# Patient Record
Sex: Female | Born: 1970 | Race: White | Hispanic: No | Marital: Married | Smoking: Never smoker
Health system: Southern US, Community
[De-identification: ages and names within clinical notes are randomized; demographics above are authoritative.]

## PROBLEM LIST (undated history)

## (undated) DIAGNOSIS — K754 Autoimmune hepatitis: Secondary | ICD-10-CM

## (undated) DIAGNOSIS — T7840XA Allergy, unspecified, initial encounter: Secondary | ICD-10-CM

## (undated) DIAGNOSIS — R569 Unspecified convulsions: Secondary | ICD-10-CM

## (undated) DIAGNOSIS — K219 Gastro-esophageal reflux disease without esophagitis: Secondary | ICD-10-CM

## (undated) DIAGNOSIS — K589 Irritable bowel syndrome without diarrhea: Secondary | ICD-10-CM

## (undated) DIAGNOSIS — K2270 Barrett's esophagus without dysplasia: Secondary | ICD-10-CM

## (undated) DIAGNOSIS — K449 Diaphragmatic hernia without obstruction or gangrene: Secondary | ICD-10-CM

## (undated) DIAGNOSIS — I10 Essential (primary) hypertension: Secondary | ICD-10-CM

## (undated) DIAGNOSIS — R945 Abnormal results of liver function studies: Secondary | ICD-10-CM

## (undated) DIAGNOSIS — G43909 Migraine, unspecified, not intractable, without status migrainosus: Secondary | ICD-10-CM

## (undated) DIAGNOSIS — E785 Hyperlipidemia, unspecified: Secondary | ICD-10-CM

## (undated) DIAGNOSIS — E119 Type 2 diabetes mellitus without complications: Secondary | ICD-10-CM

## (undated) DIAGNOSIS — R7989 Other specified abnormal findings of blood chemistry: Secondary | ICD-10-CM

## (undated) DIAGNOSIS — M1990 Unspecified osteoarthritis, unspecified site: Secondary | ICD-10-CM

## (undated) HISTORY — DX: Autoimmune hepatitis: K75.4

## (undated) HISTORY — DX: Type 2 diabetes mellitus without complications: E11.9

## (undated) HISTORY — DX: Migraine, unspecified, not intractable, without status migrainosus: G43.909

## (undated) HISTORY — DX: Unspecified osteoarthritis, unspecified site: M19.90

## (undated) HISTORY — DX: Unspecified convulsions: R56.9

## (undated) HISTORY — DX: Gastro-esophageal reflux disease without esophagitis: K21.9

## (undated) HISTORY — DX: Allergy, unspecified, initial encounter: T78.40XA

## (undated) HISTORY — DX: Other specified abnormal findings of blood chemistry: R79.89

## (undated) HISTORY — DX: Diaphragmatic hernia without obstruction or gangrene: K44.9

## (undated) HISTORY — DX: Irritable bowel syndrome, unspecified: K58.9

## (undated) HISTORY — PX: LASIK: SHX215

## (undated) HISTORY — DX: Hyperlipidemia, unspecified: E78.5

## (undated) HISTORY — DX: Abnormal results of liver function studies: R94.5

## (undated) HISTORY — DX: Barrett's esophagus without dysplasia: K22.70

## (undated) HISTORY — DX: Essential (primary) hypertension: I10

## (undated) HISTORY — PX: WISDOM TOOTH EXTRACTION: SHX21

---

## 2000-12-10 ENCOUNTER — Encounter: Payer: Self-pay | Admitting: Internal Medicine

## 2000-12-10 ENCOUNTER — Encounter: Admission: RE | Admit: 2000-12-10 | Discharge: 2000-12-10 | Payer: Self-pay | Admitting: Internal Medicine

## 2001-05-07 ENCOUNTER — Encounter: Payer: Self-pay | Admitting: Internal Medicine

## 2001-05-07 ENCOUNTER — Encounter: Admission: RE | Admit: 2001-05-07 | Discharge: 2001-05-07 | Payer: Self-pay | Admitting: Internal Medicine

## 2002-11-01 ENCOUNTER — Emergency Department (HOSPITAL_COMMUNITY): Admission: EM | Admit: 2002-11-01 | Discharge: 2002-11-01 | Payer: Self-pay | Admitting: Emergency Medicine

## 2002-11-01 ENCOUNTER — Encounter: Payer: Self-pay | Admitting: Emergency Medicine

## 2003-04-09 HISTORY — PX: UPPER GASTROINTESTINAL ENDOSCOPY: SHX188

## 2003-06-01 ENCOUNTER — Other Ambulatory Visit: Admission: RE | Admit: 2003-06-01 | Discharge: 2003-06-01 | Payer: Self-pay | Admitting: Internal Medicine

## 2003-12-02 ENCOUNTER — Encounter: Admission: RE | Admit: 2003-12-02 | Discharge: 2003-12-02 | Payer: Self-pay | Admitting: Internal Medicine

## 2004-05-07 ENCOUNTER — Other Ambulatory Visit: Admission: RE | Admit: 2004-05-07 | Discharge: 2004-05-07 | Payer: Self-pay | Admitting: Family Medicine

## 2005-04-08 HISTORY — PX: COLONOSCOPY: SHX174

## 2005-05-20 ENCOUNTER — Other Ambulatory Visit: Admission: RE | Admit: 2005-05-20 | Discharge: 2005-05-20 | Payer: Self-pay | Admitting: Family Medicine

## 2005-05-24 ENCOUNTER — Encounter: Admission: RE | Admit: 2005-05-24 | Discharge: 2005-05-24 | Payer: Self-pay | Admitting: Family Medicine

## 2005-07-22 ENCOUNTER — Encounter: Admission: RE | Admit: 2005-07-22 | Discharge: 2005-07-22 | Payer: Self-pay | Admitting: Family Medicine

## 2005-11-29 ENCOUNTER — Ambulatory Visit: Payer: Self-pay | Admitting: Internal Medicine

## 2005-12-04 ENCOUNTER — Encounter: Payer: Self-pay | Admitting: Internal Medicine

## 2005-12-04 ENCOUNTER — Ambulatory Visit: Payer: Self-pay | Admitting: Internal Medicine

## 2006-02-04 ENCOUNTER — Ambulatory Visit: Payer: Self-pay | Admitting: Internal Medicine

## 2006-06-03 ENCOUNTER — Ambulatory Visit: Payer: Self-pay | Admitting: Internal Medicine

## 2006-06-03 LAB — CONVERTED CEMR LAB
Bilirubin, Direct: 0.1 mg/dL (ref 0.0–0.3)
Calcium: 8.9 mg/dL (ref 8.4–10.5)
Direct LDL: 136.4 mg/dL
Eosinophils Absolute: 0.1 10*3/uL (ref 0.0–0.6)
Eosinophils Relative: 1.5 % (ref 0.0–5.0)
GFR calc Af Amer: 148 mL/min
GFR calc non Af Amer: 122 mL/min
Glucose, Bld: 80 mg/dL (ref 70–99)
HDL: 50.4 mg/dL (ref >39.0)
Lymphocytes Relative: 27.7 % (ref 12.0–46.0)
MCV: 93.5 fL (ref 78.0–100.0)
Neutro Abs: 3.9 10*3/uL (ref 1.4–7.7)
Neutrophils Relative %: 64.9 % (ref 43.0–77.0)
Platelets: 256 10*3/uL (ref 150–400)
Potassium: 3.7 meq/L (ref 3.5–5.1)
Sodium: 143 meq/L (ref 135–145)
WBC: 6 10*3/uL (ref 4.5–10.5)

## 2006-06-11 ENCOUNTER — Encounter: Payer: Self-pay | Admitting: Internal Medicine

## 2006-06-11 ENCOUNTER — Other Ambulatory Visit: Admission: RE | Admit: 2006-06-11 | Discharge: 2006-06-11 | Payer: Self-pay | Admitting: Internal Medicine

## 2006-06-11 ENCOUNTER — Ambulatory Visit: Payer: Self-pay | Admitting: Internal Medicine

## 2006-12-09 DIAGNOSIS — K219 Gastro-esophageal reflux disease without esophagitis: Secondary | ICD-10-CM | POA: Insufficient documentation

## 2007-03-13 ENCOUNTER — Telehealth: Payer: Self-pay | Admitting: Internal Medicine

## 2007-05-29 ENCOUNTER — Telehealth (INDEPENDENT_AMBULATORY_CARE_PROVIDER_SITE_OTHER): Payer: Self-pay | Admitting: *Deleted

## 2007-07-30 ENCOUNTER — Encounter: Payer: Self-pay | Admitting: Internal Medicine

## 2007-11-12 ENCOUNTER — Telehealth: Payer: Self-pay | Admitting: Internal Medicine

## 2007-11-13 ENCOUNTER — Telehealth: Payer: Self-pay | Admitting: Internal Medicine

## 2007-12-04 DIAGNOSIS — K2270 Barrett's esophagus without dysplasia: Secondary | ICD-10-CM

## 2007-12-04 DIAGNOSIS — Z8639 Personal history of other endocrine, nutritional and metabolic disease: Secondary | ICD-10-CM

## 2007-12-04 DIAGNOSIS — Z862 Personal history of diseases of the blood and blood-forming organs and certain disorders involving the immune mechanism: Secondary | ICD-10-CM

## 2007-12-04 DIAGNOSIS — Z87898 Personal history of other specified conditions: Secondary | ICD-10-CM

## 2007-12-04 DIAGNOSIS — K589 Irritable bowel syndrome without diarrhea: Secondary | ICD-10-CM

## 2007-12-07 ENCOUNTER — Ambulatory Visit: Payer: Self-pay | Admitting: Internal Medicine

## 2007-12-08 ENCOUNTER — Telehealth: Payer: Self-pay | Admitting: Internal Medicine

## 2007-12-10 ENCOUNTER — Ambulatory Visit: Payer: Self-pay | Admitting: Internal Medicine

## 2007-12-10 ENCOUNTER — Encounter: Payer: Self-pay | Admitting: Internal Medicine

## 2007-12-15 ENCOUNTER — Telehealth: Payer: Self-pay | Admitting: Internal Medicine

## 2007-12-15 ENCOUNTER — Encounter: Payer: Self-pay | Admitting: Internal Medicine

## 2008-05-23 ENCOUNTER — Ambulatory Visit: Payer: Self-pay | Admitting: Internal Medicine

## 2008-05-23 DIAGNOSIS — E785 Hyperlipidemia, unspecified: Secondary | ICD-10-CM

## 2008-05-23 DIAGNOSIS — N979 Female infertility, unspecified: Secondary | ICD-10-CM | POA: Insufficient documentation

## 2008-05-23 DIAGNOSIS — N644 Mastodynia: Secondary | ICD-10-CM

## 2008-05-23 DIAGNOSIS — R5383 Other fatigue: Secondary | ICD-10-CM

## 2008-05-23 DIAGNOSIS — R5381 Other malaise: Secondary | ICD-10-CM | POA: Insufficient documentation

## 2008-05-30 LAB — CONVERTED CEMR LAB
Albumin: 3.8 g/dL (ref 3.5–5.2)
Alkaline Phosphatase: 97 units/L (ref 39–117)
BUN: 13 mg/dL (ref 6–23)
Calcium: 9.2 mg/dL (ref 8.4–10.5)
Cholesterol: 255 mg/dL (ref 0–200)
Eosinophils Absolute: 0.2 10*3/uL (ref 0.0–0.7)
Eosinophils Relative: 1.4 % (ref 0.0–5.0)
Free T4: 0.7 ng/dL (ref 0.6–1.6)
GFR calc Af Amer: 105 mL/min
GFR calc non Af Amer: 87 mL/min
HCT: 40.4 % (ref 36.0–46.0)
MCV: 93.2 fL (ref 78.0–100.0)
Monocytes Absolute: 0.3 10*3/uL (ref 0.1–1.0)
Neutro Abs: 8.7 10*3/uL — ABNORMAL HIGH (ref 1.4–7.7)
Platelets: 252 10*3/uL (ref 150–400)
Potassium: 4.3 meq/L (ref 3.5–5.1)
RDW: 11.4 % — ABNORMAL LOW (ref 11.5–14.6)
Sodium: 140 meq/L (ref 135–145)
TSH: 1.74 microintl units/mL (ref 0.35–5.50)
Triglycerides: 158 mg/dL — ABNORMAL HIGH (ref 0–149)

## 2008-06-08 ENCOUNTER — Ambulatory Visit (HOSPITAL_COMMUNITY): Admission: RE | Admit: 2008-06-08 | Discharge: 2008-06-08 | Payer: Self-pay | Admitting: Obstetrics and Gynecology

## 2008-06-27 ENCOUNTER — Ambulatory Visit: Payer: Self-pay | Admitting: Internal Medicine

## 2008-06-27 ENCOUNTER — Telehealth: Payer: Self-pay | Admitting: Internal Medicine

## 2008-06-27 LAB — CONVERTED CEMR LAB
Albumin: 3.7 g/dL (ref 3.5–5.2)
Alkaline Phosphatase: 78 units/L (ref 39–117)
Ferritin: 74.2 ng/mL (ref 10.0–291.0)
HCV Ab: NEGATIVE

## 2008-07-05 ENCOUNTER — Ambulatory Visit: Payer: Self-pay | Admitting: Internal Medicine

## 2008-07-05 DIAGNOSIS — M2550 Pain in unspecified joint: Secondary | ICD-10-CM

## 2008-07-05 DIAGNOSIS — R945 Abnormal results of liver function studies: Secondary | ICD-10-CM | POA: Insufficient documentation

## 2008-08-16 ENCOUNTER — Ambulatory Visit: Payer: Self-pay | Admitting: Internal Medicine

## 2008-08-16 DIAGNOSIS — R1011 Right upper quadrant pain: Secondary | ICD-10-CM

## 2008-08-17 ENCOUNTER — Ambulatory Visit: Payer: Self-pay | Admitting: Internal Medicine

## 2008-08-17 LAB — CONVERTED CEMR LAB
Alkaline Phosphatase: 67 units/L (ref 39–117)
Anti Nuclear Antibody(ANA): NEGATIVE
Bilirubin, Direct: 0.1 mg/dL (ref 0.0–0.3)
INR: 1 (ref 0.8–1.0)
IgA: 142 mg/dL (ref 68–378)
IgG (Immunoglobin G), Serum: 1028 mg/dL (ref 694–1618)
Lipase: 24 units/L (ref 11.0–59.0)
Prothrombin Time: 10.7 s — ABNORMAL LOW (ref 10.9–13.3)
Sed Rate: 16 mm/hr (ref 0–22)
Total Bilirubin: 0.5 mg/dL (ref 0.3–1.2)
Total Protein: 6.4 g/dL (ref 6.0–8.3)

## 2008-08-29 ENCOUNTER — Encounter: Payer: Self-pay | Admitting: Internal Medicine

## 2008-09-22 ENCOUNTER — Ambulatory Visit (HOSPITAL_COMMUNITY): Admission: RE | Admit: 2008-09-22 | Discharge: 2008-09-22 | Payer: Self-pay | Admitting: Internal Medicine

## 2009-04-08 HISTORY — PX: OTHER SURGICAL HISTORY: SHX169

## 2009-10-24 ENCOUNTER — Ambulatory Visit (HOSPITAL_COMMUNITY): Admission: RE | Admit: 2009-10-24 | Discharge: 2009-10-24 | Payer: Self-pay | Admitting: Obstetrics and Gynecology

## 2009-11-14 ENCOUNTER — Encounter (INDEPENDENT_AMBULATORY_CARE_PROVIDER_SITE_OTHER): Payer: Self-pay | Admitting: *Deleted

## 2010-01-12 ENCOUNTER — Inpatient Hospital Stay (HOSPITAL_COMMUNITY): Admission: AD | Admit: 2010-01-12 | Discharge: 2010-02-01 | Payer: Self-pay | Admitting: Obstetrics and Gynecology

## 2010-01-15 ENCOUNTER — Encounter: Payer: Self-pay | Admitting: Obstetrics and Gynecology

## 2010-01-22 ENCOUNTER — Encounter: Payer: Self-pay | Admitting: Obstetrics and Gynecology

## 2010-01-25 ENCOUNTER — Encounter: Payer: Self-pay | Admitting: Obstetrics and Gynecology

## 2010-01-29 ENCOUNTER — Encounter (INDEPENDENT_AMBULATORY_CARE_PROVIDER_SITE_OTHER): Payer: Self-pay | Admitting: Obstetrics and Gynecology

## 2010-02-01 ENCOUNTER — Encounter
Admission: RE | Admit: 2010-02-01 | Discharge: 2010-03-03 | Payer: Self-pay | Source: Home / Self Care | Admitting: Obstetrics and Gynecology

## 2010-04-28 ENCOUNTER — Encounter: Payer: Self-pay | Admitting: Obstetrics and Gynecology

## 2010-05-04 ENCOUNTER — Ambulatory Visit
Admission: RE | Admit: 2010-05-04 | Discharge: 2010-05-04 | Payer: Self-pay | Source: Home / Self Care | Attending: Internal Medicine | Admitting: Internal Medicine

## 2010-05-04 DIAGNOSIS — S301XXA Contusion of abdominal wall, initial encounter: Secondary | ICD-10-CM | POA: Insufficient documentation

## 2010-05-04 DIAGNOSIS — S139XXA Sprain of joints and ligaments of unspecified parts of neck, initial encounter: Secondary | ICD-10-CM | POA: Insufficient documentation

## 2010-05-04 LAB — CONVERTED CEMR LAB
Bilirubin Urine: NEGATIVE
Blood in Urine, dipstick: NEGATIVE
Glucose, Urine, Semiquant: NEGATIVE
Protein, U semiquant: NEGATIVE
Specific Gravity, Urine: 1.01
pH: 5

## 2010-05-08 NOTE — Letter (Signed)
Summary: Endoscopy Letter  [facility]  [address] [place], [place] [address]   Phone: [phone]  Fax: [phone]      November 14, 2009 MRN: [id number]   Priscilla Harris [address] [place], [place]  [address]   Dear [name],   According to your medical record, it is time for you to schedule an Endoscopy. Endoscopic screening is recommended for patients with certain upper digestive tract conditions because of associated increased risk for cancers of the upper digestive system.  This letter has been generated based on the recommendations made at the time of your prior procedure. If you feel that in your particular situation this may no longer apply, please contact our office.  Please call our office at [phone]) to schedule this appointment or to update your records at your earliest convenience.  Thank you for cooperating with [place] to provide you with the very best care possible.   Sincerely,  Hedwig Morton. Juanda Chance, M.D.  [facility] Gastroenterology [facility] [phone]
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
## 2010-05-31 ENCOUNTER — Encounter: Payer: Self-pay | Admitting: Internal Medicine

## 2010-06-01 ENCOUNTER — Ambulatory Visit: Payer: Self-pay | Admitting: Internal Medicine

## 2010-06-20 LAB — CBC
HCT: 32.3 % — ABNORMAL LOW (ref 36.0–46.0)
HCT: 34.7 % — ABNORMAL LOW (ref 36.0–46.0)
Hemoglobin: 10.9 g/dL — ABNORMAL LOW (ref 12.0–15.0)
Hemoglobin: 11.1 g/dL — ABNORMAL LOW (ref 12.0–15.0)
Hemoglobin: 11.5 g/dL — ABNORMAL LOW (ref 12.0–15.0)
MCH: 32.6 pg (ref 26.0–34.0)
MCH: 32.7 pg (ref 26.0–34.0)
MCHC: 34.3 g/dL (ref 30.0–36.0)
MCV: 94.9 fL (ref 78.0–100.0)
MCV: 95 fL (ref 78.0–100.0)
MCV: 95.2 fL (ref 78.0–100.0)
MCV: 95.5 fL (ref 78.0–100.0)
Platelets: 191 10*3/uL (ref 150–400)
Platelets: 192 10*3/uL (ref 150–400)
Platelets: 234 10*3/uL (ref 150–400)
RBC: 3.36 MIL/uL — ABNORMAL LOW (ref 3.87–5.11)
RBC: 3.4 MIL/uL — ABNORMAL LOW (ref 3.87–5.11)
RBC: 3.54 MIL/uL — ABNORMAL LOW (ref 3.87–5.11)
RBC: 3.65 MIL/uL — ABNORMAL LOW (ref 3.87–5.11)
RDW: 12.8 % (ref 11.5–15.5)
RDW: 12.9 % (ref 11.5–15.5)
WBC: 10.3 10*3/uL (ref 4.0–10.5)
WBC: 10.9 10*3/uL — ABNORMAL HIGH (ref 4.0–10.5)
WBC: 12.1 10*3/uL — ABNORMAL HIGH (ref 4.0–10.5)
WBC: 19.4 10*3/uL — ABNORMAL HIGH (ref 4.0–10.5)
WBC: 22.3 10*3/uL — ABNORMAL HIGH (ref 4.0–10.5)

## 2010-06-20 LAB — COMPREHENSIVE METABOLIC PANEL
ALT: 17 U/L (ref 0–35)
AST: 35 U/L (ref 0–37)
Albumin: 1.9 g/dL — ABNORMAL LOW (ref 3.5–5.2)
Albumin: 2 g/dL — ABNORMAL LOW (ref 3.5–5.2)
Alkaline Phosphatase: 112 U/L (ref 39–117)
Alkaline Phosphatase: 118 U/L — ABNORMAL HIGH (ref 39–117)
BUN: 10 mg/dL (ref 6–23)
BUN: 9 mg/dL (ref 6–23)
CO2: 23 mEq/L (ref 19–32)
Calcium: 8 mg/dL — ABNORMAL LOW (ref 8.4–10.5)
Chloride: 105 mEq/L (ref 96–112)
Chloride: 109 mEq/L (ref 96–112)
Creatinine, Ser: 0.48 mg/dL (ref 0.4–1.2)
Creatinine, Ser: 0.64 mg/dL (ref 0.4–1.2)
GFR calc Af Amer: >60 mL/min (ref >60)
GFR calc non Af Amer: >60 mL/min (ref >60)
Glucose, Bld: 92 mg/dL (ref 70–99)
Potassium: 3.8 mEq/L (ref 3.5–5.1)
Potassium: 3.9 mEq/L (ref 3.5–5.1)
Potassium: 4.1 mEq/L (ref 3.5–5.1)
Sodium: 134 mEq/L — ABNORMAL LOW (ref 135–145)
Total Bilirubin: 0.2 mg/dL — ABNORMAL LOW (ref 0.3–1.2)
Total Bilirubin: 0.3 mg/dL (ref 0.3–1.2)
Total Protein: 5 g/dL — ABNORMAL LOW (ref 6.0–8.3)
Total Protein: 5.2 g/dL — ABNORMAL LOW (ref 6.0–8.3)

## 2010-06-20 LAB — URINALYSIS, MICROSCOPIC ONLY
Bilirubin Urine: NEGATIVE
Nitrite: NEGATIVE
Specific Gravity, Urine: >1.03 — ABNORMAL HIGH (ref 1.005–1.030)
pH: 6 (ref 5.0–8.0)

## 2010-06-20 LAB — URIC ACID
Uric Acid, Serum: 5.1 mg/dL (ref 2.4–7.0)
Uric Acid, Serum: 5.3 mg/dL (ref 2.4–7.0)

## 2010-06-20 LAB — STREP B DNA PROBE

## 2010-06-20 LAB — URINE CULTURE: Special Requests: POSITIVE

## 2010-06-20 LAB — LACTATE DEHYDROGENASE
LDH: 71 U/L — ABNORMAL LOW (ref 94–250)
LDH: 80 U/L — ABNORMAL LOW (ref 94–250)

## 2010-06-20 LAB — RPR: RPR Ser Ql: NONREACTIVE

## 2010-06-21 LAB — COMPREHENSIVE METABOLIC PANEL
ALT: 18 U/L (ref 0–35)
ALT: 19 U/L (ref 0–35)
AST: 17 U/L (ref 0–37)
AST: 18 U/L (ref 0–37)
Albumin: 2.3 g/dL — ABNORMAL LOW (ref 3.5–5.2)
Alkaline Phosphatase: 115 U/L (ref 39–117)
Alkaline Phosphatase: 118 U/L — ABNORMAL HIGH (ref 39–117)
BUN: 11 mg/dL (ref 6–23)
BUN: 8 mg/dL (ref 6–23)
CO2: 21 mEq/L (ref 19–32)
CO2: 22 mEq/L (ref 19–32)
Calcium: 8.5 mg/dL (ref 8.4–10.5)
Calcium: 8.5 mg/dL (ref 8.4–10.5)
Creatinine, Ser: 0.54 mg/dL (ref 0.4–1.2)
GFR calc Af Amer: >60 mL/min (ref >60)
GFR calc Af Amer: >60 mL/min (ref >60)
GFR calc non Af Amer: >60 mL/min (ref >60)
GFR calc non Af Amer: >60 mL/min (ref >60)
Glucose, Bld: 79 mg/dL (ref 70–99)
Potassium: 4 mEq/L (ref 3.5–5.1)
Potassium: 4.1 mEq/L (ref 3.5–5.1)
Sodium: 134 mEq/L — ABNORMAL LOW (ref 135–145)
Sodium: 134 mEq/L — ABNORMAL LOW (ref 135–145)
Total Bilirubin: 0.1 mg/dL — ABNORMAL LOW (ref 0.3–1.2)
Total Protein: 4.5 g/dL — ABNORMAL LOW (ref 6.0–8.3)
Total Protein: 5.3 g/dL — ABNORMAL LOW (ref 6.0–8.3)

## 2010-06-21 LAB — CBC
HCT: 31.9 % — ABNORMAL LOW (ref 36.0–46.0)
HCT: 33.7 % — ABNORMAL LOW (ref 36.0–46.0)
Hemoglobin: 11 g/dL — ABNORMAL LOW (ref 12.0–15.0)
Hemoglobin: 11.5 g/dL — ABNORMAL LOW (ref 12.0–15.0)
MCH: 32.6 pg (ref 26.0–34.0)
MCH: 32.6 pg (ref 26.0–34.0)
MCHC: 34 g/dL (ref 30.0–36.0)
MCHC: 34.3 g/dL (ref 30.0–36.0)
MCHC: 34.4 g/dL (ref 30.0–36.0)
MCV: 94.8 fL (ref 78.0–100.0)
Platelets: 192 10*3/uL (ref 150–400)
RBC: 3.51 MIL/uL — ABNORMAL LOW (ref 3.87–5.11)
RBC: 3.55 MIL/uL — ABNORMAL LOW (ref 3.87–5.11)
RDW: 12.6 % (ref 11.5–15.5)
WBC: 12.7 10*3/uL — ABNORMAL HIGH (ref 4.0–10.5)

## 2010-06-21 LAB — CREATININE CLEARANCE, URINE, 24 HOUR
Collection Interval-CRCL: 24 hours
Creatinine Clearance: 231 mL/min — ABNORMAL HIGH (ref 75–115)
Creatinine, 24H Ur: 1531 mg/d (ref 700–1800)
Creatinine, Urine: 62.5 mg/dL

## 2010-06-21 LAB — URIC ACID
Uric Acid, Serum: 5 mg/dL (ref 2.4–7.0)
Uric Acid, Serum: 5.5 mg/dL (ref 2.4–7.0)

## 2010-06-21 LAB — RPR: RPR Ser Ql: NONREACTIVE

## 2010-06-21 LAB — PROTEIN, URINE, 24 HOUR
Protein, Urine: 126 mg/dL
Urine Total Volume-UPROT: 2450 mL

## 2010-06-21 LAB — LACTATE DEHYDROGENASE: LDH: 77 U/L — ABNORMAL LOW (ref 94–250)

## 2010-08-17 ENCOUNTER — Ambulatory Visit: Payer: Self-pay | Admitting: Internal Medicine

## 2010-08-24 NOTE — Assessment & Plan Note (Signed)
[facility]                           GASTROENTEROLOGY OFFICE NOTE   NAME:DANIELS, ZAKARIA FROMER                    MRN:          [id number]  DATE:11/29/2005                            DOB:          03-02-1971    Ms. Garner Nash is a 40 year old white female who was referred by Dr. Lynelle Doctor for  evaluation of dyspepsia and abdominal pain, irregular bowel habits which are  not responsive to standard therapy.  Ms. Garner Nash dates onset of her symptoms  to about three years ago after returning from the beach she became sick and  since then she has had problems with a little reflux, occasional dysphagia,  pain with certain exercises, cramps and gas proceeding every bowel movement.  There is also nausea preceding bowel movements.  The diarrhea is usually 3-4  bowel movements a day, occasionally at night.  She has seen some blood when  she wipes but not in the stool.  Her weight has been fluctuating between 155  pounds down to 125 pounds, currently up to 148 pounds.  She was diagnosed  with irritable bowel syndrome based on her symptoms of diarrhea and  abdominal discomfort three years ago.  She has tried proton pump inhibitors  which seem to control this reflux at times but currently her AcipHex 20 mg  b.i.d. does not seem to control the reflux which occurs after meals.  She  has had occasional loss of appetite.  She denies any stress.  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  She denies being  depressed or anxious.   MEDICATIONS:  1. Birth control pills started in February of this year.  2. AcipHex 20 mg p.o. b.i.d.  3. Allegra.   SURGERIES:  Eye surgery.   FAMILY HISTORY:  Maternal grandmother had colon cancer.  She is our patient  and her mother has colon polyps.  Patient herself never had a colonoscopy.  Grandmother also had heart disease.  Great-grandmother ovarian cancer.  Grandmother breast cancer.  Grandfather had diabetes.   SOCIAL HISTORY:  She is a Engineer, maintenance (IT), Runner, broadcasting/film/video.  She is single.  Does  not smoke.  Drinks alcohol only socially.   REVIEW OF SYSTEMS:  Positive for occasional fever.  Allergies, frequent  cough , urination, sleeping problems, pain with periods, back pain, muscle  cramps, __________ changes, new depression.   PHYSICAL EXAMINATION:  VITAL SIGNS:  Blood pressure 124/86. Pulse 60 and  weight 148 pounds.  GENERAL:  She was somewhat nervous, anxious, but very cooperative.  HEENT:  Oropharynx cavity with normal.  NECK:  Supple with no adenopathy.  SKIN:  Warm and dry without stigmata of current liver disease.  LUNGS:  Clear to auscultation.  HEART:  COR with a rapid S1, S2.  ABDOMEN:  Soft, but diffusely tender only more so in the left lower  quadrant, also in the epigastrium.  There was no distention and no tympany.  Bowel sound were normal active.  Liver edge was at costal margin.  RECTAL:  Shows normal perianal area.  Rectal tone was normal.  There was  small amount  of heme negative stool in the ampulla.  EXTREMITIES:  No edema.   IMPRESSION:  20. A 40 year old white female with symptoms of gastroesophageal reflux      occurring despite high dose of proton pump inhibitor.  Rule out bile      reflux.  Rule out hiatal hernia, esophageal dysmotility, rule out H.      pylori gastropathy.  2. Recent normal upper abdominal ultrasound by Dr. Mosetta Anis needs      to be reviewed for a biliary dysfunction.  There is no family history      of gallbladder problems.  3. Irritable bowel syndrome/diarrhea - rule out inflammatory bowel      disease, rule out metabolic causes such as thyroid dysfunction, rule      out endometriosis.   PLAN:  1. Start Levbid 0.375 mg p.o. b.i.d. as an antispasmodic.  2. Consider adding Reglan for gastroesophageal reflux but will hold off at      this time because of possible side effects  3. Protonix - if this would be covered by insurance, [facility], start 40 mg b.i.d. Samples given x2 weeks of 40 mg p.o. q. day      #4.  4. Upper and lower endoscopy both are scheduled.  Endoscopy, we will check      H pylori, rule out hiatal hernia and gastritis.  Colonoscopy, rule out      microscopic colitis.  5. Stool panel, tissue transglutaminase, a TSH.  We will obtain results of      the abdominal ultrasound from Dr. Prentiss Bells.                                   Hedwig Morton. Juanda Chance, MD   DMB/MedQ  DD:  11/29/2005  DT:  11/29/2005  Job #:  [id number]   cc:   Lavonda Jumbo, MD

## 2010-08-24 NOTE — Assessment & Plan Note (Signed)
[facility]                            [facility] OFFICE NOTE   NAME:Priscilla Harris                    MRN:          [id number]  DATE:06/11/2006                            DOB:          [birth year]    CHIEF COMPLAINT:  New patient needs checkup with pap smear and  medication refills.   HISTORY OF PRESENT ILLNESS:  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 Her previous care  was through [facility] originally Dr. Prentiss Bells and eventually with  Dr. Lynelle Doctor. She is due for a checkup and needs a refill on her Ortho Tri-  Cyclen, which she has been on for contraception, but also apparently  suppression. She has done well on this, although there is some question  about premenstrual moodiness with this, but is otherwise okay. She also  a history of allergic rhinitis for which she needs a refill of Allegra  180 mg daily. Other significant past medical history is for GERD,  recently on Protonix and Pepcid p.r.n. She is not responsive to AcipHex.  She had recently seen Dr. Juanda Chance who has done a EGD and diagnosed  Barrett's esophagus. She is to followup in a year with a followup  endoscopy for this problem.   PAST MEDICAL HISTORY:  See database.   PAST SURGICAL HISTORY:  LASIK eye surgery in 2000 with some tooth  removal in 1995. Seasonal rhinitis. Otherwise negative. Her last pap  smear was in 06/2005, she is primiparous. She is not sure when her  tetanus shot was. Her last colon screen was in 2007.   FAMILY HISTORY:  Her parents are with elevated cholesterol and  hypertension. Grandparents generation with colon cancer, MGM, breast  cancer, stroke, high blood pressure, PGF, renal disease in great  grandmother and some paternal uncles. Ovarian and uterine cancer in  great grandmother. Negative for thyroid disease. Questionable  osteoporosis in grandmother. A sibling with JRA and some question of  positive antiphospholipid antibodies obtained during a questioned  fertility workup. There is a negative family history of blood clots by  interview.   SOCIAL HISTORY:  Household of 1 with no pets. Social alcohol 1-2 times  per month, negative tobacco, occasional alcohol. She is employed as a  Runner, broadcasting/film/video as above.   REVIEW OF SYSTEMS:  Negative for chest pain, shortness of breath, GI/GU  except as per history of present illness.   MEDICATIONS:  1. Protonix 40 mg daily.  2. Pepcid 40 mg daily.  3. Allegra 180 mg 1 daily.  4. Ortho Tri-Cyclen.   ALLERGIES:  SULFA  PENICILLIN   OBJECTIVE:  VITAL SIGNS: Height 5 foot 2 1/2 inches, weight 149, pulse  72 and regular, resting blood pressure 120/80.  GENERAL: Well developed, well nourished, healthy appearing lady in no  acute distress.  HEENT: Normocephalic, tympanic membranes clear. Eyes: Pupils equal,  round, and reactive to light, sclerae anicteric.__________  NECK: Without masses, thyromegaly, or bruit.  CHEST: CTAP.  CARDIAC: S1, S2, no gallops or murmurs.  Peripheral pulses present  bilaterally.  ABDOMEN: Soft without organomegaly or any rebound.  EXTREMITIES: No focal atrophy, good range of motion.  PELVIC: External genitalia normal.  Cervix is clear, +1 white yellow  discharge.  Bimanual exam - adnexa clear. Pap smear was done.   LABORATORY DATA:  Shows the CMET is normal, except for triglycerides  161, cholesterol 215.   IMPRESSION:  1. Well woman exam, wellness exam, continue her OCPs with the Tri-      Cyclen. Monitor for potential side effects, we can always change      the formulation and she will get back with [place] about that. Refill x1      year, dispense number 3.  2. Allergic rhinitis. Refill her Allegra 180 mg x1 year.  3. GERD and history of Barrett's esophagus as per Dr. Juanda Chance. Do a re-      check in 1 year or as needed.     Neta Mends. Panosh,  MD  Electronically Signed    WKP/MedQ  DD: 06/11/2006  DT: 06/12/2006  Job #: [id number]

## 2010-08-24 NOTE — Assessment & Plan Note (Signed)
[facility]                           GASTROENTEROLOGY OFFICE NOTE   NAME:DANIELS, CHYAN CARNERO                    MRN:          [id number]  DATE:02/04/2006                            DOB:          [birth year]    Ms. Garner Nash is a very nice 40 year old white female with irritable bowel  syndrome, who was recently found to have a Barrett's esophagus on upper  endoscopy on December 04, 2005, showing intestinal metaplasia; this was a  short segment with Barrett's.  She has been on Protonix 40 mg twice a day,  but really could not tolerate the large dose and feels better taking 40 mg  once a day.  She also has a history of abnormal liver function tests,  currently not a problem.  Colonoscopy was negative and did not show any  evidence of microscopic or collagenous colitis.  Her diarrhea has improved  on dietary modifications.  Her tissue transglutaminase was negative for  sprue.  Overall, she has done well.  Her weight has remained stable.   We have discussed Barrett's esophagus today, antireflux measures,  modification of her eating habits and need to lose about 20 pounds.  She  will start exercising.  I advised that she take Protonix 40 mg in the  morning and Pepcid 40 mg at bedtime.  She needs a recall upper endoscopy in  2 years.  She is also on Levbid 0.375 mg for irritable bowel syndrome; she  may take that on a p.r.n. basis.  I will see her if her symptoms clear up.     Hedwig Morton. Juanda Chance, MD  Electronically Signed    DMB/MedQ  DD: 02/04/2006  DT: 02/05/2006  Job #: [id number]   cc:   Lavonda Jumbo, M.D.

## 2011-06-03 ENCOUNTER — Other Ambulatory Visit: Payer: Self-pay | Admitting: Internal Medicine

## 2011-06-05 ENCOUNTER — Other Ambulatory Visit: Payer: Self-pay | Admitting: Internal Medicine

## 2011-06-28 ENCOUNTER — Encounter: Payer: Self-pay | Admitting: *Deleted

## 2011-07-12 ENCOUNTER — Encounter: Payer: Self-pay | Admitting: Internal Medicine

## 2011-07-12 ENCOUNTER — Ambulatory Visit (INDEPENDENT_AMBULATORY_CARE_PROVIDER_SITE_OTHER): Payer: BC Managed Care – PPO | Admitting: Internal Medicine

## 2011-07-12 VITALS — BP 136/90 | HR 100 | Ht 63.0 in | Wt 178.2 lb

## 2011-07-12 DIAGNOSIS — K2270 Barrett's esophagus without dysplasia: Secondary | ICD-10-CM

## 2011-07-12 MED ORDER — AMBULATORY NON FORMULARY MEDICATION: Status: DC

## 2011-07-12 MED ORDER — PANTOPRAZOLE SODIUM 40 MG PO TBEC: 40.0000 mg | DELAYED_RELEASE_TABLET | Freq: Every day | ORAL | Status: DC

## 2011-07-12 NOTE — Patient Instructions (Signed)
You have been scheduled for an endoscopy with propofol. Please follow written instructions given to you at your visit today. We have sent the following medications to your pharmacy for you to pick up at your convenience: Pantoprazole Tennuate Dospan CC: Dr Berniece Andreas

## 2011-07-12 NOTE — Progress Notes (Signed)
Priscilla Harris Oct 22, 1970 MRN [id number]    History of Present Illness:  This is a 41 year old white female with a history of Barrett's esophagus diagnosed on an upper endoscopy in August 2007. Her last upper endoscopy in September 2009 did not show any evidence of intestinal metaplasia. She had a 2 cm hiatal hernia. She has a history of abnormal liver function tests which were evaluated in the past with a negative upper abdominal ultrasound and a normal HIDA scan with CCK. She has a positive family history of colon cancer in maternal grandmother and positive family history of colon polyps in her mother. A colonoscopy for evaluation of diarrhea in August 2007 was normal. There was no evidence of lymphocytic colitis. She is interested in losing about 30 pounds. She delivered a baby 17 months ago.  Past Medical History  Diagnosis Date  . Allergy   . Migraines   . LFTs abnormal   . GERD (gastroesophageal reflux disease)   . Barrett's esophagus   . Irritable bowel syndrome   . Hiatal hernia    Past Surgical History  Procedure Date  . Lasik   . Wisdom tooth extraction     reports that she has never smoked. She has never used smokeless tobacco. She reports that she drinks alcohol. She reports that she does not use illicit drugs. family history includes Arthritis in her sister; Breast cancer in her paternal grandmother; Colon cancer in her maternal grandmother; Colon polyps in her mother; Diabetes in her maternal grandfather; Heart disease in her maternal grandmother; Hyperlipidemia in her father; Hypertension in her mother; Kidney disease in her maternal grandmother; Ovarian cancer in her maternal grandmother; and Stroke in her paternal grandmother. Allergies  Allergen Reactions  . Penicillins   . Sulfonamide Derivatives         Review of Systems: Denies heartburn dysphagia odynophagia or chest pain  The remainder of the 10 point ROS is negative except as outlined in H&P   Physical  Exam: General appearance  Well developed, in no distress. Moderately overweight Eyes- non icteric. HEENT nontraumatic, normocephalic. Mouth no lesions, tongue papillated, no cheilosis. Neck supple without adenopathy, thyroid not enlarged, no carotid bruits, no JVD. Lungs Clear to auscultation bilaterally. Cor normal S1, normal S2, regular rhythm, no murmur,  quiet precordium. Abdomen: Soft nontender mildly obese. Normal active bowel sounds. No tenderness. Liver edge at costal margin. Rectal: Not done. Extremities no pedal edema. Skin no lesions. Neurological alert and oriented x 3. Psychological normal mood and affect.  Assessment and Plan:  Problem #1 Gastroesophageal reflux disease with a history of Barrett's esophagus not reproduced on the last endoscopy in 2009. We will proceed with an upper endoscopy and biopsies. We will refill her pantoprazole 40 mg a day. She needs to continue on antireflux measures including weight loss.  Problem #2 Positive family history of colon cancer in indirect relatives. She will be due for a repeat colonoscopy in 2017.  Problem #3 Weight gain. She is interested in weight loss. She needs to lose 30 pounds. We will start her on Tennuate Dospan 75 mg daily for the next 3 months. She will also be on a diet and increase her daily exercise.   07/12/2011 [name]

## 2011-07-15 ENCOUNTER — Other Ambulatory Visit: Payer: Self-pay | Admitting: Internal Medicine

## 2011-07-15 NOTE — Telephone Encounter (Signed)
Gave pharmacist a verbal order for Tennuate Dospan since they apparently did not get our faxed rx. I have advised patient of this as well.

## 2011-07-19 ENCOUNTER — Telehealth: Payer: Self-pay | Admitting: Internal Medicine

## 2011-07-19 NOTE — Telephone Encounter (Signed)
Left a message for patient to call back. 

## 2011-07-19 NOTE — Telephone Encounter (Signed)
She ought to take it at bedtime for couple of weeks so she can get used to it.

## 2011-07-19 NOTE — Telephone Encounter (Signed)
Patient left a message stating the Tenuate Dospan that Dr. Juanda Chance gave her made her feel like she had a Benadryl. Feels sluggish and her face is red. States she took it for the first time today mid morning. Tried unsuccessfully to reach patient. Left her a voice mail to hold medication until she hears back from [place]. Please, advise if patient should stop medication.

## 2011-07-22 NOTE — Telephone Encounter (Signed)
Spoke with patient and gave her Dr. Brodie's recommendation.  

## 2011-07-22 NOTE — Telephone Encounter (Signed)
Left a message for patient to call me. 

## 2011-08-27 ENCOUNTER — Encounter: Payer: BC Managed Care – PPO | Admitting: Internal Medicine

## 2011-09-26 ENCOUNTER — Other Ambulatory Visit (INDEPENDENT_AMBULATORY_CARE_PROVIDER_SITE_OTHER): Payer: BC Managed Care – PPO

## 2011-09-26 DIAGNOSIS — Z0000 Encounter for general adult medical examination without abnormal findings: Secondary | ICD-10-CM

## 2011-09-26 LAB — CBC WITH DIFFERENTIAL/PLATELET
Basophils Relative: 0.4 % (ref 0.0–3.0)
Eosinophils Relative: 1.3 % (ref 0.0–5.0)
Lymphocytes Relative: 19 % (ref 12.0–46.0)
Monocytes Absolute: 0.4 10*3/uL (ref 0.1–1.0)
Monocytes Relative: 4.1 % (ref 3.0–12.0)
Neutrophils Relative %: 75.2 % (ref 43.0–77.0)
Platelets: 266 10*3/uL (ref 150.0–400.0)
RBC: 4.79 Mil/uL (ref 3.87–5.11)
WBC: 9.7 10*3/uL (ref 4.5–10.5)

## 2011-09-26 LAB — BASIC METABOLIC PANEL
BUN: 9 mg/dL (ref 6–23)
Calcium: 9.1 mg/dL (ref 8.4–10.5)
Creatinine, Ser: 0.8 mg/dL (ref 0.4–1.2)
GFR: 91.35 mL/min (ref >60.00)

## 2011-09-26 LAB — HEPATIC FUNCTION PANEL
ALT: 31 U/L (ref 0–35)
AST: 17 U/L (ref 0–37)
Alkaline Phosphatase: 94 U/L (ref 39–117)
Bilirubin, Direct: 0 mg/dL (ref 0.0–0.3)
Total Bilirubin: 0.6 mg/dL (ref 0.3–1.2)
Total Protein: 7.8 g/dL (ref 6.0–8.3)

## 2011-09-26 LAB — LIPID PANEL
Cholesterol: 222 mg/dL — ABNORMAL HIGH (ref 0–200)
Total CHOL/HDL Ratio: 4
VLDL: 42.2 mg/dL — ABNORMAL HIGH (ref 0.0–40.0)

## 2011-09-26 LAB — POCT URINALYSIS DIPSTICK
Blood, UA: NEGATIVE
Glucose, UA: NEGATIVE
Leukocytes, UA: NEGATIVE
Nitrite, UA: NEGATIVE
Urobilinogen, UA: 0.2
pH, UA: 6.5

## 2011-10-01 ENCOUNTER — Other Ambulatory Visit: Payer: Self-pay

## 2011-10-08 ENCOUNTER — Encounter: Payer: Self-pay | Admitting: Internal Medicine

## 2011-10-08 ENCOUNTER — Ambulatory Visit (INDEPENDENT_AMBULATORY_CARE_PROVIDER_SITE_OTHER): Payer: BC Managed Care – PPO | Admitting: Internal Medicine

## 2011-10-08 VITALS — BP 150/100 | HR 91 | Temp 98.7°F | Ht 62.75 in | Wt 174.0 lb

## 2011-10-08 DIAGNOSIS — Z0000 Encounter for general adult medical examination without abnormal findings: Secondary | ICD-10-CM

## 2011-10-08 DIAGNOSIS — E785 Hyperlipidemia, unspecified: Secondary | ICD-10-CM

## 2011-10-08 DIAGNOSIS — R030 Elevated blood-pressure reading, without diagnosis of hypertension: Secondary | ICD-10-CM

## 2011-10-08 DIAGNOSIS — Z23 Encounter for immunization: Secondary | ICD-10-CM

## 2011-10-08 DIAGNOSIS — E669 Obesity, unspecified: Secondary | ICD-10-CM

## 2011-10-08 DIAGNOSIS — R7301 Impaired fasting glucose: Secondary | ICD-10-CM

## 2011-10-08 NOTE — Patient Instructions (Addendum)
Discontinue sweet tea advise tracking intake including liquids and solids and activity level. Consider my fitnesspal app. Triglycerides well of late with simple sugars such as the sweet tea animal fats.  Adequate sleep is needed for successful weight control. Recheck her blood pressure occasionally it slightly up today. Your liver tests are normal today.  Hypertriglyceridemia  Diet for High blood levels of Triglycerides Most fats in food are triglycerides. Triglycerides in your blood are stored as fat in your body. High levels of triglycerides in your blood may put you at a greater risk for heart disease and stroke.  Normal triglyceride levels are less than 150 mg/dL. Borderline high levels are 150-199 mg/dl. High levels are 200 - 499 mg/dL, and very high triglyceride levels are greater than 500 mg/dL. The decision to treat high triglycerides is generally based on the level. For people with borderline or high triglyceride levels, treatment includes weight loss and exercise. Drugs are recommended for people with very high triglyceride levels. Many people who need treatment for high triglyceride levels have metabolic syndrome. This syndrome is a collection of disorders that often include: insulin resistance, high blood pressure, blood clotting problems, high cholesterol and triglycerides. TESTING PROCEDURE FOR TRIGLYCERIDES  You should not eat 4 hours before getting your triglycerides measured. The normal range of triglycerides is between 10 and 250 milligrams per deciliter (mg/dl). Some people may have extreme levels (1000 or above), but your triglyceride level may be too high if it is above 150 mg/dl, depending on what other risk factors you have for heart disease.   People with high blood triglycerides may also have high blood cholesterol levels. If you have high blood cholesterol as well as high blood triglycerides, your risk for heart disease is probably greater than if you only had high  triglycerides. High blood cholesterol is one of the main risk factors for heart disease.  CHANGING YOUR DIET  Your weight can affect your blood triglyceride level. If you are more than 20% above your ideal body weight, you may be able to lower your blood triglycerides by losing weight. Eating less and exercising regularly is the best way to combat this. Fat provides more calories than any other food. The best way to lose weight is to eat less fat. Only 30% of your total calories should come from fat. Less than 7% of your diet should come from saturated fat. A diet low in fat and saturated fat is the same as a diet to decrease blood cholesterol. By eating a diet lower in fat, you may lose weight, lower your blood cholesterol, and lower your blood triglyceride level.  Eating a diet low in fat, especially saturated fat, may also help you lower your blood triglyceride level. Ask your dietitian to help you figure how much fat you can eat based on the number of calories your caregiver has prescribed for you.  Exercise, in addition to helping with weight loss may also help lower triglyceride levels.   Alcohol can increase blood triglycerides. You may need to stop drinking alcoholic beverages.   Too much carbohydrate in your diet may also increase your blood triglycerides. Some complex carbohydrates are necessary in your diet. These may include bread, rice, potatoes, other starchy vegetables and cereals.   Reduce "simple" carbohydrates. These may include pure sugars, candy, honey, and jelly without losing other nutrients. If you have the kind of high blood triglycerides that is affected by the amount of carbohydrates in your diet, you will need to eat less  sugar and less high-sugar foods. Your caregiver can help you with this.   Adding 2-4 grams of fish oil (EPA+ DHA) may also help lower triglycerides. Speak with your caregiver before adding any supplements to your regimen.  Following the Diet  Maintain your  ideal weight. Your caregivers can help you with a diet. Generally, eating less food and getting more exercise will help you lose weight. Joining a weight control group may also help. Ask your caregivers for a good weight control group in your area.  Eat low-fat foods instead of high-fat foods. This can help you lose weight too.  These foods are lower in fat. Eat MORE of these:   Dried beans, peas, and lentils.   Egg whites.   Low-fat cottage cheese.   Fish.   Lean cuts of meat, such as round, sirloin, rump, and flank (cut extra fat off meat you fix).   Whole grain breads, cereals and pasta.   Skim and nonfat dry milk.   Low-fat yogurt.   Poultry without the skin.   Cheese made with skim or part-skim milk, such as mozzarella, parmesan, farmers', ricotta, or pot cheese.  These are higher fat foods. Eat LESS of these:   Whole milk and foods made from whole milk, such as American, blue, cheddar, monterey jack, and swiss cheese   High-fat meats, such as luncheon meats, sausages, knockwurst, bratwurst, hot dogs, ribs, corned beef, ground pork, and regular ground beef.   Fried foods.  Limit saturated fats in your diet. Substituting unsaturated fat for saturated fat may decrease your blood triglyceride level. You will need to read package labels to know which products contain saturated fats.  These foods are high in saturated fat. Eat LESS of these:   Fried pork skins.   Whole milk.   Skin and fat from poultry.   Palm oil.   Butter.   Shortening.   Cream cheese.   Tomasa Blase.   Margarines and baked goods made from listed oils.   Vegetable shortenings.   Chitterlings.   Fat from meats.   Coconut oil.   Palm kernel oil.   Lard.   Cream.   Sour cream.   Fatback.   Coffee whiteners and non-dairy creamers made with these oils.   Cheese made from whole milk.  Use unsaturated fats (both polyunsaturated and monounsaturated) moderately. Remember, even though  unsaturated fats are better than saturated fats; you still want a diet low in total fat.  These foods are high in unsaturated fat:   Canola oil.   Sunflower oil.   Mayonnaise.   Almonds.   Peanuts.   Pine nuts.   Margarines made with these oils.   Safflower oil.   Olive oil.   Avocados.   Cashews.   Peanut butter.   Sunflower seeds.   Soybean oil.   Peanut oil.   Olives.   Pecans.   Walnuts.   Pumpkin seeds.  Avoid sugar and other high-sugar foods. This will decrease carbohydrates without decreasing other nutrients. Sugar in your food goes rapidly to your blood. When there is excess sugar in your blood, your liver may use it to make more triglycerides. Sugar also contains calories without other important nutrients.  Eat LESS of these:   Sugar, brown sugar, powdered sugar, jam, jelly, preserves, honey, syrup, molasses, pies, candy, cakes, cookies, frosting, pastries, colas, soft drinks, punches, fruit drinks, and regular gelatin.   Avoid alcohol. Alcohol, even more than sugar, may increase blood triglycerides. In addition, alcohol is  high in calories and low in nutrients. Ask for sparkling water, or a diet soft drink instead of an alcoholic beverage.  Suggestions for planning and preparing meals   Bake, broil, grill or roast meats instead of frying.   Remove fat from meats and skin from poultry before cooking.   Add spices, herbs, lemon juice or vinegar to vegetables instead of salt, rich sauces or gravies.   Use a non-stick skillet without fat or use no-stick sprays.   Cool and refrigerate stews and broth. Then remove the hardened fat floating on the surface before serving.   Refrigerate meat drippings and skim off fat to make low-fat gravies.   Serve more fish.   Use less butter, margarine and other high-fat spreads on bread or vegetables.   Use skim or reconstituted non-fat dry milk for cooking.   Cook with low-fat cheeses.   Substitute low-fat  yogurt or cottage cheese for all or part of the sour cream in recipes for sauces, dips or congealed salads.   Use half yogurt/half mayonnaise in salad recipes.   Substitute evaporated skim milk for cream. Evaporated skim milk or reconstituted non-fat dry milk can be whipped and substituted for whipped cream in certain recipes.   Choose fresh fruits for dessert instead of high-fat foods such as pies or cakes. Fruits are naturally low in fat.  When Dining Out   Order low-fat appetizers such as fruit or vegetable juice, pasta with vegetables or tomato sauce.   Select clear, rather than cream soups.   Ask that dressings and gravies be served on the side. Then use less of them.   Order foods that are baked, broiled, poached, steamed, stir-fried, or roasted.   Ask for margarine instead of butter, and use only a small amount.   Drink sparkling water, unsweetened tea or coffee, or diet soft drinks instead of alcohol or other sweet beverages.  QUESTIONS AND ANSWERS ABOUT OTHER FATS IN THE BLOOD: SATURATED FAT, TRANS FAT, AND CHOLESTEROL What is trans fat? Trans fat is a type of fat that is formed when vegetable oil is hardened through a process called hydrogenation. This process helps makes foods more solid, gives them shape, and prolongs their shelf life. Trans fats are also called hydrogenated or partially hydrogenated oils.  What do saturated fat, trans fat, and cholesterol in foods have to do with heart disease? Saturated fat, trans fat, and cholesterol in the diet all raise the level of LDL "bad" cholesterol in the blood. The higher the LDL cholesterol, the greater the risk for coronary heart disease (CHD). Saturated fat and trans fat raise LDL similarly.  What foods contain saturated fat, trans fat, and cholesterol? High amounts of saturated fat are found in animal products, such as fatty cuts of meat, chicken skin, and full-fat dairy products like butter, whole milk, cream, and cheese, and in  tropical vegetable oils such as palm, palm kernel, and coconut oil. Trans fat is found in some of the same foods as saturated fat, such as vegetable shortening, some margarines (especially hard or stick margarine), crackers, cookies, baked goods, fried foods, salad dressings, and other processed foods made with partially hydrogenated vegetable oils. Small amounts of trans fat also occur naturally in some animal products, such as milk products, beef, and lamb. Foods high in cholesterol include liver, other organ meats, egg yolks, shrimp, and full-fat dairy products. How can I use the new food label to make heart-healthy food choices? Check the Nutrition Facts panel of the food  label. Choose foods lower in saturated fat, trans fat, and cholesterol. For saturated fat and cholesterol, you can also use the Percent Daily Value (%DV): 5% DV or less is low, and 20% DV or more is high. (There is no %DV for trans fat.) Use the Nutrition Facts panel to choose foods low in saturated fat and cholesterol, and if the trans fat is not listed, read the ingredients and limit products that list shortening or hydrogenated or partially hydrogenated vegetable oil, which tend to be high in trans fat. POINTS TO REMEMBER: YOU NEED A LITTLE TLC (THERAPEUTIC LIFESTYLE CHANGES)  Discuss your risk for heart disease with your caregivers, and take steps to reduce risk factors.   Change your diet. Choose foods that are low in saturated fat, trans fat, and cholesterol.   Add exercise to your daily routine if it is not already being done. Participate in physical activity of moderate intensity, like brisk walking, for at least 30 minutes on most, and preferably all days of the week. No time? Break the 30 minutes into three, 10-minute segments during the day.   Stop smoking. If you do smoke, contact your caregiver to discuss ways in which they can help you quit.   Do not use street drugs.   Maintain a normal weight.   Maintain a  healthy blood pressure.   Keep up with your blood work for checking the fats in your blood as directed by your caregiver.  Document Released: 01/11/2004 Document Revised: 03/14/2011 Document Reviewed: 08/08/2008 [employer] Patient Information 2012 [place], [place].

## 2011-10-08 NOTE — Progress Notes (Signed)
Subjective:    Patient ID: Priscilla Harris, female    DOB: Jan 18, 1971, 41 y.o.   MRN: [id number]  HPI Patient comes in today for preventive visit and follow-up of medical issues. Update  history since  last visit: Doing ok  No major change in health status since last visit . No injury or  Sees Dr Juanda Chance for barretts . Some sleep deprivation with 37 month old sleeping issues recently  Has had pap and utd  Gyne. To have endo this week.  Review of Systems  Outpatient Encounter Prescriptions as of 10/08/2011  Medication Sig Dispense Refill  . Diethylpropion HCl CR 75 MG TB24 Take by mouth.      . pantoprazole (PROTONIX) 40 MG tablet Take 1 tablet (40 mg total) by mouth daily.  30 tablet  10  . DISCONTD: AMBULATORY NON FORMULARY MEDICATION Medication Name: Tennuate Dospan 75 mg 1 tablet by mouth once daily  30 tablet  2  Past history family history social history reviewed in the electronic medical record.      Objective:   Physical Exam BP 150/100  Pulse 91  Temp 98.7 F (37.1 C) (Oral)  Ht 5' 2.75" (1.594 m)  Wt 174 lb (78.926 kg)  BMI 31.07 kg/m2  SpO2 98%  LMP 10/03/2011 Repeat BP readings 130/96 right  Large    Physical Exam: Vital signs reviewed JYN:WGNF is a well-developed well-nourished alert cooperative  white female who appears her stated age in no acute distress.  HEENT: normocephalic atraumatic , Eyes: PERRL EOM's full, conjunctiva clear, Nares: paten,t no deformity discharge or tenderness., Ears: no deformity EAC's clear TMs with normal landmarks. Mouth: clear OP, no lesions, edema.  Moist mucous membranes. Dentition in adequate repair. NECK: supple without masses, thyromegaly or bruits. CHEST/PULM:  Clear to auscultation and percussion breath sounds equal no wheeze , rales or rhonchi. No chest wall deformities or tenderness. CV: PMI is nondisplaced, S1 S2 no gallops, murmurs, rubs. Peripheral pulses are full without delay.No JVD .  ABDOMEN: Bowel sounds normal  nontender  No guard or rebound, no hepato splenomegal no CVA tenderness.  No hernia. Extremtities:  No clubbing cyanosis or edema, no acute joint swelling or redness no focal atrophy NEURO:  Oriented x3, cranial nerves 3-12 appear to be intact, no obvious focal weakness,gait within normal limits no abnormal reflexes or asymmetrical SKIN: No acute rashes normal turgor, color, no bruising or petechiae. PSYCH: Oriented, good eye contact, no obvious depression anxiety, cognition and judgment appear normal. LN: no cervical axillary inguinal adenopathy    Lab Results  Component Value Date   WBC 9.7 09/26/2011   HGB 14.7 09/26/2011   HCT 44.4 09/26/2011   PLT 266.0 09/26/2011   GLUCOSE 105* 09/26/2011   CHOL 222* 09/26/2011   TRIG 211.0* 09/26/2011   HDL 49.70 09/26/2011   LDLDIRECT 146.4 09/26/2011   ALT 31 09/26/2011   AST 17 09/26/2011   NA 137 09/26/2011   K 4.1 09/26/2011   CL 107 09/26/2011   CREATININE 0.8 09/26/2011   BUN 9 09/26/2011   CO2 21 09/26/2011   TSH 1.47 09/26/2011   INR 1.0 ratio 08/17/2008      Assessment & Plan:  Preventive Health Care Counseled regarding healthy nutrition, exercise, sleep, injury prevention, calcium vit d and healthy weight .  tdap  Update   BP elevation today   Disc documentation of hypertension and rx if needed.  And risk  GI  To see Dr Juanda Chance   Re  barretts.  fam hx of autoimmunie hepatitis  Has bee on some type of dietary pill for weight loss ? Obesity Counseled.  Elevated TG and FBS  High risk dysmetabolic    Counseled.

## 2011-10-09 ENCOUNTER — Encounter: Payer: Self-pay | Admitting: Internal Medicine

## 2011-10-09 ENCOUNTER — Ambulatory Visit (AMBULATORY_SURGERY_CENTER): Payer: BC Managed Care – PPO | Admitting: Internal Medicine

## 2011-10-09 VITALS — BP 146/96 | HR 92 | Temp 98.2°F | Resp 16 | Ht 63.0 in | Wt 178.0 lb

## 2011-10-09 DIAGNOSIS — K2270 Barrett's esophagus without dysplasia: Secondary | ICD-10-CM

## 2011-10-09 DIAGNOSIS — K210 Gastro-esophageal reflux disease with esophagitis: Secondary | ICD-10-CM

## 2011-10-09 MED ORDER — SODIUM CHLORIDE 0.9 % IV SOLN: 500.0000 mL | INTRAVENOUS | Status: DC

## 2011-10-09 NOTE — Patient Instructions (Addendum)
CONTINUE YOUR PROTONIX.  ANTI REFLUX MEASURES.  CONTINUE YOUR WEIGHT LOSS.  NEXT ENDOSCOPY IN 3 YEARS.  YOU HAD AN ENDOSCOPIC PROCEDURE TODAY AT THE [facility]: Refer to the procedure report that was given to you for any specific questions about what was found during the examination.  If the procedure report does not answer your questions, please call your gastroenterologist to clarify.  If you requested that your care partner not be given the details of your procedure findings, then the procedure report has been included in a sealed envelope for you to review at your convenience later.  YOU SHOULD EXPECT: Some feelings of bloating in the abdomen. Passage of more gas than usual.  Walking can help get rid of the air that was put into your GI tract during the procedure and reduce the bloating. If you had a lower endoscopy (such as a colonoscopy or flexible sigmoidoscopy) you may notice spotting of blood in your stool or on the toilet paper. If you underwent a bowel prep for your procedure, then you may not have a normal bowel movement for a few days.  DIET: Your first meal following the procedure should be a light meal and then it is ok to progress to your normal diet.  A half-sandwich or bowl of soup is an example of a good first meal.  Heavy or fried foods are harder to digest and may make you feel nauseous or bloated.  Likewise meals heavy in dairy and vegetables can cause extra gas to form and this can also increase the bloating.  Drink plenty of fluids but you should avoid alcoholic beverages for 24 hours.  ACTIVITY: Your care partner should take you home directly after the procedure.  You should plan to take it easy, moving slowly for the rest of the day.  You can resume normal activity the day after the procedure however you should NOT DRIVE or use heavy machinery for 24 hours (because of the sedation medicines used during the test).    SYMPTOMS TO REPORT IMMEDIATELY: A  gastroenterologist can be reached at any hour.  During normal business hours, 8:30 AM to 5:00 PM Monday through Friday, call [phone].  After hours and on weekends, please call the GI answering service at [phone] who will take a message and have the physician on call contact you.   Following lower endoscopy (colonoscopy or flexible sigmoidoscopy):  Excessive amounts of blood in the stool  Significant tenderness or worsening of abdominal pains  Swelling of the abdomen that is new, acute  Fever of 100F or higher  Following upper endoscopy (EGD)  Vomiting of blood or coffee ground material  New chest pain or pain under the shoulder blades  Painful or persistently difficult swallowing  New shortness of breath  Fever of 100F or higher  Black, tarry-looking stools  FOLLOW UP: If any biopsies were taken you will be contacted by phone or by letter within the next 1-3 weeks.  Call your gastroenterologist if you have not heard about the biopsies in 3 weeks.  Our staff will call the home number listed on your records the next business day following your procedure to check on you and address any questions or concerns that you may have at that time regarding the information given to you following your procedure. This is a courtesy call and so if there is no answer at the home number and we have not heard from you through the emergency physician on call, we  will assume that you have returned to your regular daily activities without incident.  SIGNATURES/CONFIDENTIALITY: You and/or your care partner have signed paperwork which will be entered into your electronic medical record.  These signatures attest to the fact that that the information above on your After Visit Summary has been reviewed and is understood.  Full responsibility of the confidentiality of this discharge information lies with you and/or your care-partner.

## 2011-10-09 NOTE — Op Note (Signed)
[facility] [address] [place], [place]  [address]  ENDOSCOPY PROCEDURE REPORT  PATIENT:  Priscilla Harris, Priscilla Harris  MR#:  [id number] BIRTHDATE:  [birth year], 41 yrs. old  GENDER:  female  ENDOSCOPIST:  Hedwig Morton. Juanda Chance, MD Referred by:  Neta Mends. Panosh, M.D.  PROCEDURE DATE:  10/09/2011 PROCEDURE:  EGD with biopsy, [address] ASA CLASS:  Class I INDICATIONS:  h/o Barrett's Esophagus, GERD Barrett's esophagus in 2007, no Barrett's in 2009,  MEDICATIONS:   MAC sedation, administered by CRNA, propofol (Diprivan) 150 mg TOPICAL ANESTHETIC:  Cetacaine Spray  DESCRIPTION OF PROCEDURE:   After the risks benefits and alternatives of the procedure were thoroughly explained, informed consent was obtained.  The LB-GIF Q180 [id number] endoscope was introduced through the mouth and advanced to the second portion of the duodenum, without limitations.  The instrument was slowly withdrawn as the mucosa was fully examined. <<PROCEDUREIMAGES>>  irregular Z-line. With standard forceps, a biopsy was obtained and sent to pathology (see image1 and image5).  Otherwise the examination was normal (see image4, image3, and image2). Retroflexed views revealed no abnormalities.    The scope was then withdrawn from the patient and the procedure completed.  COMPLICATIONS:  None  ENDOSCOPIC IMPRESSION: 1) Irregular Z-line 2) Otherwise normal examination no hiatal hernia RECOMMENDATIONS: 1) Await biopsy results 2) Anti-reflux regimen to be follow continue PPI continue weight loss  REPEAT EXAM:  In 3 year(s) for.  if no Barrett's again, we may not need to reendoscope in 3 years  ______________________________ Hedwig Morton. Juanda Chance, MD  CC:  n. eSIGNED:   Hedwig Morton. Twanda Stakes at 10/09/2011 09:08 AM  [place]on Lions, [phone]

## 2011-10-09 NOTE — Progress Notes (Signed)
Patient did not experience any of the following events: a burn prior to discharge; a fall within the facility; wrong site/side/patient/procedure/implant event; or a hospital transfer or hospital admission upon discharge from the facility. (G8907) Patient did not have preoperative order for IV antibiotic SSI prophylaxis. (G8918)  

## 2011-10-11 ENCOUNTER — Telehealth: Payer: Self-pay | Admitting: *Deleted

## 2011-10-11 NOTE — Telephone Encounter (Signed)
  Follow up Call-  Call back number 10/09/2011  Post procedure Call Back phone  # [phone]  Permission to leave phone message Yes     Patient questions:  Do you have a fever, pain , or abdominal swelling? no Pain Score  0 *  Have you tolerated food without any problems? yes  Have you been able to return to your normal activities? yes  Do you have any questions about your discharge instructions: Diet   no Medications  no Follow up visit  no  Do you have questions or concerns about your Care? no  Actions: * If pain score is 4 or above: No action needed, pain <4.

## 2011-10-13 ENCOUNTER — Encounter: Payer: Self-pay | Admitting: Internal Medicine

## 2011-10-13 DIAGNOSIS — R7301 Impaired fasting glucose: Secondary | ICD-10-CM | POA: Insufficient documentation

## 2011-10-13 DIAGNOSIS — E669 Obesity, unspecified: Secondary | ICD-10-CM | POA: Insufficient documentation

## 2011-10-13 DIAGNOSIS — Z0000 Encounter for general adult medical examination without abnormal findings: Secondary | ICD-10-CM | POA: Insufficient documentation

## 2011-10-13 DIAGNOSIS — R030 Elevated blood-pressure reading, without diagnosis of hypertension: Secondary | ICD-10-CM | POA: Insufficient documentation

## 2011-10-14 ENCOUNTER — Encounter: Payer: Self-pay | Admitting: Internal Medicine

## 2012-08-12 ENCOUNTER — Other Ambulatory Visit: Payer: Self-pay | Admitting: Internal Medicine

## 2013-01-20 ENCOUNTER — Other Ambulatory Visit: Payer: BC Managed Care – PPO

## 2013-01-27 ENCOUNTER — Encounter: Payer: BC Managed Care – PPO | Admitting: Internal Medicine

## 2013-05-10 ENCOUNTER — Other Ambulatory Visit: Payer: Self-pay | Admitting: Internal Medicine

## 2013-05-11 ENCOUNTER — Other Ambulatory Visit: Payer: Self-pay | Admitting: Internal Medicine

## 2013-05-11 ENCOUNTER — Telehealth: Payer: Self-pay | Admitting: Internal Medicine

## 2013-05-11 NOTE — Telephone Encounter (Signed)
I already sent medication earlier today.

## 2013-06-08 ENCOUNTER — Ambulatory Visit (INDEPENDENT_AMBULATORY_CARE_PROVIDER_SITE_OTHER): Payer: BC Managed Care – PPO | Admitting: Internal Medicine

## 2013-06-08 ENCOUNTER — Encounter: Payer: Self-pay | Admitting: Internal Medicine

## 2013-06-08 VITALS — BP 114/80 | HR 100 | Ht 62.25 in | Wt 182.5 lb

## 2013-06-08 DIAGNOSIS — K222 Esophageal obstruction: Secondary | ICD-10-CM

## 2013-06-08 MED ORDER — PANTOPRAZOLE SODIUM 40 MG PO TBEC: DELAYED_RELEASE_TABLET | ORAL | Status: DC

## 2013-06-08 NOTE — Progress Notes (Signed)
Priscilla Harris 1971-01-20 [id number]  Note: This dictation was prepared with Dragon digital system. Any transcriptional errors that result from this procedure are unintentional.   History of Present Illness: This is a 43 year old white female here to refill Protonix. She has a history of gastroesophageal reflux. Barrett's esophagus diagnosed on upper endoscopy in August 2007. Most recent endoscopy in 2009 and in April 2013 did not show any evidence of Barrett's esophagus. Her symptoms of reflux are controlled on Protonix 40 mg daily. There is a family history of colon cancer in maternal grandmother and an family history of colon polyps in her mother. Last colonoscopy in August 2007 was normal she is currently on Weight Watchers diet trying to lose 15-20 pounds. She has episodes of  severe epigastric pain related to spasm of the abdominal muscles. It is usually precipitated by bending over, coughing or exercising. It is not associated with the digestive functions    Past Medical History  Diagnosis Date  . Migraines   . LFTs abnormal   . GERD (gastroesophageal reflux disease)   . Barrett's esophagus   . Irritable bowel syndrome   . Hiatal hernia   . Allergy     SEASONAL  . Seizures     FEBRILE SEIZURES UNDER 2    Past Surgical History  Procedure Laterality Date  . Lasik    . Wisdom tooth extraction      Allergies  Allergen Reactions  . Penicillins Hives    HAD SEIZURES AT THIS TIME AS WELL  . Sulfonamide Derivatives Swelling    SWELLING OF THE HEAD/FACE    Family history and social history have been reviewed.  Review of Systems: Negative for dysphagia  The remainder of the 10 point ROS is negative except as outlined in the H&P  Physical Exam: General Appearance Well developed, in no distress Eyes  Non icteric  HEENT  Non traumatic, normocephalic  Mouth No lesion, tongue papillated, no cheilosis Neck Supple without adenopathy, thyroid not enlarged, no carotid bruits,  no JVD Lungs Clear to auscultation bilaterally COR Normal S1, normal S2, regular rhythm, no murmur, quiet precordium Abdomen  mildly obese. Normoactive bowel sounds. I cannot identify any evidence of rectus diathesis there is no ventral or umbilical hernia Rectal not done Extremities  No pedal edema Skin No lesions Neurological Alert and oriented x 3 Psychological Normal mood and affect  Assessment and Plan:   Gastro-. esophageal reflux controlled on Protonix 40 mg daily. We will refill Protonix 40 mg daily for another year. Last 2 upper endoscopies did not confirm Barrett's esophagus. No recall scheduled at this time  Colorectal screening. Recall colonoscopy August 2017    Delfin Edis 06/08/2013

## 2013-06-08 NOTE — Patient Instructions (Signed)
We have sent the following prescriptions to your mail in pharmacy: pantoprazole  If you have not heard from your mail in pharmacy within 1 week or if you have not received your medication in the mail, please contact us at [phone] so we may find out why.  You will be due for a recall colonoscopy in 11/2015. We will send you a reminder in the mail when it gets closer to that time.  CC:Dr Hasanaj

## 2015-07-05 ENCOUNTER — Encounter: Payer: Self-pay | Admitting: Internal Medicine

## 2015-10-25 ENCOUNTER — Encounter: Payer: Self-pay | Admitting: Gastroenterology

## 2019-03-10 ENCOUNTER — Ambulatory Visit: Payer: BC Managed Care – PPO | Admitting: Cardiology

## 2019-03-23 ENCOUNTER — Ambulatory Visit: Payer: BC Managed Care – PPO | Admitting: Cardiology

## 2021-02-20 ENCOUNTER — Encounter: Payer: Self-pay | Admitting: Gastroenterology

## 2021-04-19 ENCOUNTER — Telehealth: Payer: Self-pay | Admitting: *Deleted

## 2021-04-19 NOTE — Telephone Encounter (Signed)
Pt no-showed 11am virtual pre-visit Attempted pt. At home number at 1102am, no answer. No machine to leave message. Immediately called cell, no answer. Mailbox full.Called pt at 1105am, both numbers, same as above.  Called pt at 1111am, no answer either number. Unable to leave message on cell, mailbox full. Home number did not have machine.PV and 1/26 colon cancelled. Mailed pt no show letter.

## 2021-04-20 ENCOUNTER — Encounter: Payer: Self-pay | Admitting: Gastroenterology

## 2021-04-20 NOTE — Telephone Encounter (Signed)
Patient called and stated that she was supposed to have got a call yesterday. I advised her that there had been multiple attempts and no answers.  Patient stated that all numbers were old and number was not verified at time of scheduling.  Patients number was changed and she was rescheduled.

## 2021-05-03 ENCOUNTER — Encounter: Payer: BC Managed Care – PPO | Admitting: Gastroenterology

## 2021-05-08 ENCOUNTER — Ambulatory Visit (AMBULATORY_SURGERY_CENTER): Payer: BLUE CROSS/BLUE SHIELD | Admitting: *Deleted

## 2021-05-08 ENCOUNTER — Other Ambulatory Visit: Payer: Self-pay

## 2021-05-08 VITALS — Ht 63.0 in | Wt 190.0 lb

## 2021-05-08 DIAGNOSIS — Z1211 Encounter for screening for malignant neoplasm of colon: Secondary | ICD-10-CM

## 2021-05-08 MED ORDER — NA SULFATE-K SULFATE-MG SULF 17.5-3.13-1.6 GM/177ML PO SOLN: 1.0000 | Freq: Once | ORAL | 0 refills | Status: AC

## 2021-05-08 NOTE — Progress Notes (Signed)
No egg or soy allergy known to patient  No issues known to pt with past sedation with any surgeries or procedures Patient denies ever being told they had issues or difficulty with intubation  No FH of Malignant Hyperthermia Pt is not on diet pills Pt is not on  home 02  Pt is not on blood thinners  Pt denies issues with constipation  No A fib or A flutter  Pt is fully vaccinated  for Covid   NO PA's for preps discussed with pt In PV today  Discussed with pt there will be an out-of-pocket cost for prep and that varies from $0 to 70 +  dollars - pt verbalized understanding   Due to the COVID-19 pandemic we are asking patients to follow certain guidelines in PV and the [place]   Pt aware of COVID protocols and [facility] guidelines   PV completed over the phone. Pt verified name, DOB, address and insurance during PV today.  Pt mailed instruction packet with copy of consent form to read and not return, and instructions.  Pt encouraged to call with questions or issues.  If pt has My chart, procedure instructions sent via My Chart

## 2021-05-21 ENCOUNTER — Encounter: Payer: Self-pay | Admitting: Gastroenterology

## 2021-05-21 ENCOUNTER — Ambulatory Visit (AMBULATORY_SURGERY_CENTER): Payer: BLUE CROSS/BLUE SHIELD | Admitting: Gastroenterology

## 2021-05-21 VITALS — BP 120/78 | HR 95 | Temp 97.6°F | Resp 17 | Ht 63.0 in | Wt 190.0 lb

## 2021-05-21 DIAGNOSIS — Z1211 Encounter for screening for malignant neoplasm of colon: Secondary | ICD-10-CM | POA: Diagnosis present

## 2021-05-21 DIAGNOSIS — D123 Benign neoplasm of transverse colon: Secondary | ICD-10-CM | POA: Diagnosis not present

## 2021-05-21 MED ORDER — SODIUM CHLORIDE 0.9 % IV SOLN: 500.0000 mL | Freq: Once | INTRAVENOUS | Status: DC

## 2021-05-21 NOTE — Progress Notes (Signed)
Called to room to assist during endoscopic procedure.  Patient ID and intended procedure confirmed with present staff. Received instructions for my participation in the procedure from the performing physician.  

## 2021-05-21 NOTE — Patient Instructions (Signed)
Resume previous diet and medications. Awaiting pathology results. Repeat Colonoscopy in 10 years for surveillance based on pathology results.  YOU HAD AN ENDOSCOPIC PROCEDURE TODAY AT [facility]:   Refer to the procedure report that was given to you for any specific questions about what was found during the examination.  If the procedure report does not answer your questions, please call your gastroenterologist to clarify.  If you requested that your care partner not be given the details of your procedure findings, then the procedure report has been included in a sealed envelope for you to review at your convenience later.  YOU SHOULD EXPECT: Some feelings of bloating in the abdomen. Passage of more gas than usual.  Walking can help get rid of the air that was put into your GI tract during the procedure and reduce the bloating. If you had a lower endoscopy (such as a colonoscopy or flexible sigmoidoscopy) you may notice spotting of blood in your stool or on the toilet paper. If you underwent a bowel prep for your procedure, you may not have a normal bowel movement for a few days.  Please Note:  You might notice some irritation and congestion in your nose or some drainage.  This is from the oxygen used during your procedure.  There is no need for concern and it should clear up in a day or so.  SYMPTOMS TO REPORT IMMEDIATELY:  Following lower endoscopy (colonoscopy or flexible sigmoidoscopy):  Excessive amounts of blood in the stool  Significant tenderness or worsening of abdominal pains  Swelling of the abdomen that is new, acute  Fever of 100F or higher  For urgent or emergent issues, a gastroenterologist can be reached at any hour by calling [phone]. Do not use MyChart messaging for urgent concerns.    DIET:  We do recommend a small meal at first, but then you may proceed to your regular diet.  Drink plenty of fluids but you should avoid alcoholic beverages for 24  hours.  ACTIVITY:  You should plan to take it easy for the rest of today and you should NOT DRIVE or use heavy machinery until tomorrow (because of the sedation medicines used during the test).    FOLLOW UP: Our staff will call the number listed on your records 48-72 hours following your procedure to check on you and address any questions or concerns that you may have regarding the information given to you following your procedure. If we do not reach you, we will leave a message.  We will attempt to reach you two times.  During this call, we will ask if you have developed any symptoms of COVID 19. If you develop any symptoms (ie: fever, flu-like symptoms, shortness of breath, cough etc.) before then, please call [phone].  If you test positive for Covid 19 in the 2 weeks post procedure, please call and report this information to [place].    If any biopsies were taken you will be contacted by phone or by letter within the next 1-3 weeks.  Please call us at [phone] if you have not heard about the biopsies in 3 weeks.    SIGNATURES/CONFIDENTIALITY: You and/or your care partner have signed paperwork which will be entered into your electronic medical record.  These signatures attest to the fact that that the information above on your After Visit Summary has been reviewed and is understood.  Full responsibility of the confidentiality of this discharge information lies with you and/or your care-partner.

## 2021-05-21 NOTE — Op Note (Signed)
[facility] Patient Name: Priscilla Harris Procedure Date: 05/21/2021 3:19 PM MRN: [id number] Endoscopist: Mauri Pole , MD Age: 51 Referring MD:  Date of Birth: [birth year] Gender: Female Account #: [id number] Procedure:                Colonoscopy Indications:              Screening for colorectal malignant neoplasm Medicines:                Monitored Anesthesia Care Procedure:                Pre-Anesthesia Assessment:                           - Prior to the procedure, a History and Physical                            was performed, and patient medications and                            allergies were reviewed. The patient's tolerance of                            previous anesthesia was also reviewed. The risks                            and benefits of the procedure and the sedation                            options and risks were discussed with the patient.                            All questions were answered, and informed consent                            was obtained. Prior Anticoagulants: The patient has                            taken no previous anticoagulant or antiplatelet                            agents. ASA Grade Assessment: III - A patient with                            severe systemic disease. After reviewing the risks                            and benefits, the patient was deemed in                            satisfactory condition to undergo the procedure.                           After obtaining informed consent, the colonoscope  was passed under direct vision. Throughout the                            procedure, the patient's blood pressure, pulse, and                            oxygen saturations were monitored continuously. The                            PCF-HQ190L Colonoscope was introduced through the                            anus and advanced to the the cecum, identified by                             appendiceal orifice and ileocecal valve. The                            colonoscopy was performed without difficulty. The                            patient tolerated the procedure well. The quality                            of the bowel preparation was excellent. The                            ileocecal valve, appendiceal orifice, and rectum                            were photographed. Scope In: 3:20:11 PM Scope Out: 3:29:31 PM Scope Withdrawal Time: 0 hours 7 minutes 13 seconds  Total Procedure Duration: 0 hours 9 minutes 20 seconds  Findings:                 The perianal and digital rectal examinations were                            normal.                           A 4 mm polyp was found in the transverse colon. The                            polyp was sessile. The polyp was removed with a                            cold snare. Resection and retrieval were complete.                           Scattered small and large-mouthed diverticula were                            found in the sigmoid colon, descending colon and  ascending colon.                           Non-bleeding external and internal hemorrhoids were                            found during retroflexion. The hemorrhoids were                            small. Complications:            No immediate complications. Estimated Blood Loss:     Estimated blood loss was minimal. Impression:               - One 4 mm polyp in the transverse colon, removed                            with a cold snare. Resected and retrieved.                           - Moderate diverticulosis in the sigmoid colon, in                            the descending colon and in the ascending colon.                           - Non-bleeding external and internal hemorrhoids. Recommendation:           - Patient has a contact number available for                            emergencies. The signs and symptoms of potential                             delayed complications were discussed with the                            patient. Return to normal activities tomorrow.                            Written discharge instructions were provided to the                            patient.                           - Resume previous diet.                           - Continue present medications.                           - Await pathology results.                           - Repeat colonoscopy in 5-10 years for surveillance  based on pathology results. Mauri Pole, MD 05/21/2021 3:33:30 PM This report has been signed electronically.

## 2021-05-21 NOTE — Progress Notes (Signed)
A and O x3. Report to RN. Tolerated MAC anesthesia well. 

## 2021-05-21 NOTE — Progress Notes (Signed)
Pt's states no medical or surgical changes since previsit or office visit.  VS CW  

## 2021-05-21 NOTE — Progress Notes (Signed)
[facility] History and Physical   Primary Care Physician:  Neale Burly, MD   Reason for Procedure:  Colorectal cancer screening  Plan:    Screening colonoscopy with possible interventions as needed     HPI: Priscilla Harris is a very pleasant 51 y.o. female here for screening colonoscopy. Denies any nausea, vomiting, abdominal pain, melena or bright red blood per rectum  The risks and benefits as well as alternatives of endoscopic procedure(s) have been discussed and reviewed. All questions answered. The patient agrees to proceed.    Past Medical History:  Diagnosis Date   Allergy    SEASONAL   Arthritis    OA   Autoimmune hepatitis ([place])    2015-2017   Barrett's esophagus    PT STATES DR Olevia Perches STATED MISDIAGNOSED WITH BARRETT'S   Diabetes mellitus without complication (HCC)    GERD (gastroesophageal reflux disease)    CONTROLLED   Hiatal hernia    Hyperlipidemia    Hypertension    Irritable bowel syndrome    PAST   LFTs abnormal    Migraines    PT STATES SINUS HA NOT MIGRAINES   Seizures ([place])    FEBRILE SEIZURES UNDER 2- NONE SINCE LESS THAN AGE 50    Past Surgical History:  Procedure Laterality Date   COLONOSCOPY  2007   LASIK     nsvd  2011   x1 with Epidural   UPPER GASTROINTESTINAL ENDOSCOPY  2005   WISDOM TOOTH EXTRACTION      Prior to Admission medications   Medication Sig Start Date End Date Taking? Authorizing Provider  atorvastatin (LIPITOR) 40 MG tablet Take 40 mg by mouth at bedtime. 03/05/21  Yes [provider]  benazepril-hydrochlorthiazide (LOTENSIN HCT) 20-12.5 MG tablet Take 1 tablet by mouth daily. 03/13/21  Yes [provider]  cetirizine (ZYRTEC) 10 MG tablet Take 10 mg by mouth daily.   Yes [provider]  cholecalciferol (VITAMIN D3) 25 MCG (1000 UNIT) tablet Take 1,000 Units by mouth daily. D 3   Yes [provider]  Cyanocobalamin (VITAMIN B 12 PO) Take by mouth.   Yes  [provider]  Grape Seed Extract 100 MG CAPS Take 200 mg by mouth daily.   Yes [provider]  KRILL OIL PO Take 800 mg by mouth daily.   Yes [provider]  MATZIM LA 300 MG 24 hr tablet Take 300 mg by mouth daily. 04/16/21  Yes [provider]  metFORMIN (GLUCOPHAGE) 500 MG tablet Take 500 mg by mouth daily. 02/12/21  Yes [provider]  omeprazole (PRILOSEC) 20 MG capsule Take 20 mg by mouth daily. 02/12/21  Yes [provider]  Triamcinolone Acetonide (NASACORT AQ NA) Place into the nose.   Yes [provider]  acyclovir (ZOVIRAX) 400 MG tablet Take by mouth.    [provider]  diclofenac Sodium (VOLTAREN) 1 % GEL Apply topically 4 (four) times daily.    [provider]    Current Outpatient Medications  Medication Sig Dispense Refill   atorvastatin (LIPITOR) 40 MG tablet Take 40 mg by mouth at bedtime.     benazepril-hydrochlorthiazide (LOTENSIN HCT) 20-12.5 MG tablet Take 1 tablet by mouth daily.     cetirizine (ZYRTEC) 10 MG tablet Take 10 mg by mouth daily.     cholecalciferol (VITAMIN D3) 25 MCG (1000 UNIT) tablet Take 1,000 Units by mouth daily. D 3     Cyanocobalamin (VITAMIN B 12 PO) Take by mouth.  Grape Seed Extract 100 MG CAPS Take 200 mg by mouth daily.     KRILL OIL PO Take 800 mg by mouth daily.     MATZIM LA 300 MG 24 hr tablet Take 300 mg by mouth daily.     metFORMIN (GLUCOPHAGE) 500 MG tablet Take 500 mg by mouth daily.     omeprazole (PRILOSEC) 20 MG capsule Take 20 mg by mouth daily.     Triamcinolone Acetonide (NASACORT AQ NA) Place into the nose.     acyclovir (ZOVIRAX) 400 MG tablet Take by mouth.     diclofenac Sodium (VOLTAREN) 1 % GEL Apply topically 4 (four) times daily.     Current Facility-Administered Medications  Medication Dose Route Frequency Provider Last Rate Last Admin   0.9 %  sodium chloride infusion  500 mL Intravenous Once Mauri Pole, MD         Allergies as of 05/21/2021 - Review Complete 05/21/2021  Allergen Reaction Noted   Penicillins Hives 12/09/2006   Sulfonamide derivatives Swelling 12/09/2006    Family History  Problem Relation Age of Onset   Hypertension Mother        grandparents   Colon polyps Mother    Other Mother        atypical cells in breast   Hyperlipidemia Father    Arthritis Sister        grandparents   Colon cancer Maternal Grandmother        and rectal   Kidney disease Maternal Grandmother        great, paternal uncle   Ovarian cancer Maternal Grandmother        great   Heart disease Maternal Grandmother    Diabetes Maternal Grandfather    Breast cancer Paternal Grandmother    Stroke Paternal Grandmother        paternal grandfather   Esophageal cancer Neg Hx    Rectal cancer Neg Hx    Stomach cancer Neg Hx     Social History   Socioeconomic History   Marital status: Married    Spouse name: Not on file   Number of children: 1   Years of education: Not on file   Highest education level: Not on file  Occupational History   Occupation: Product manager: [employer]  Tobacco Use   Smoking status: Never   Smokeless tobacco: Never  Vaping Use   Vaping Use: Never used  Substance and Sexual Activity   Alcohol use: Yes    Comment: occas- rare- weddings etc   Drug use: Never   Sexual activity: Yes    Birth control/protection: None  Other Topics Concern   Not on file  Social History Narrative   Regular exercise: no   Consults   Dr Olevia Perches-- GI   Dr Ronita Hipps-- GYN   [facility]of  13   3 month old trying to get out of crib.    [place]  To [place]                  Social Determinants of Health   Financial Resource Strain: Not on file  Food Insecurity: Not on file  Transportation Needs: Not on file  Physical Activity: Not on file  Stress: Not on file  Social Connections: Not on file  Intimate Partner Violence: Not on file    Review of Systems:  All other  review of systems negative except as mentioned in the HPI.  Physical Exam: Vital signs in last 24  hours: BP 126/84    Pulse (!) 112    Temp 97.6 F (36.4 C) (Temporal)    Ht 5\' 3"  (1.6 m)    Wt 190 lb (86.2 kg)    LMP 05/01/2021    SpO2 97%    BMI 33.66 kg/m  General:   Alert, NAD Lungs:  Clear .   Heart:  Regular rate and rhythm Abdomen:  Soft, nontender and nondistended. Neuro/Psych:  Alert and cooperative. Normal mood and affect. A and O x 3  Reviewed labs, radiology imaging, old records and pertinent past GI work up  Patient is appropriate for planned procedure(s) and anesthesia in an ambulatory setting   K. Denzil Magnuson , MD [phone]

## 2021-05-23 ENCOUNTER — Telehealth: Payer: Self-pay | Admitting: *Deleted

## 2021-05-23 ENCOUNTER — Telehealth: Payer: Self-pay

## 2021-05-23 NOTE — Telephone Encounter (Signed)
°  Follow up Call-  Call back number 05/21/2021  Post procedure Call Back phone  # [phone]  Permission to leave phone message Yes  Some recent data might be hidden    2nd follow up call made.  NALM

## 2021-05-23 NOTE — Telephone Encounter (Signed)
First follow up call attempt.  LVM. 

## 2021-05-29 ENCOUNTER — Encounter: Payer: Self-pay | Admitting: Gastroenterology

## 2021-06-03 NOTE — Progress Notes (Deleted)
?Cardiology Office Note:   ? ?Date:  06/03/2021  ? ?ID:  Priscilla Harris, DOB 1970-07-17, MRN [id number] ? ?PCP:  Neale Burly, MD ?  ?[facility] Providers ?Cardiologist:  None { ?   ? ?Referring MD: Neale Burly, MD  ? ? ?History of Present Illness:   ? ?Priscilla Harris is a 51 y.o. female with a hx of autoimmune hepatitis, DMII, GERD, HTN, HLD, and IBS who was referred by Dr. Sherrie Sport for further evaluation of SVT. ? ?Past Medical History:  ?Diagnosis Date  ? Allergy   ? SEASONAL  ? Arthritis   ? OA  ? Autoimmune hepatitis ([place])   ? 2015-2017  ? Barrett's esophagus   ? PT STATES DR Olevia Perches STATED MISDIAGNOSED WITH BARRETT'S  ? Diabetes mellitus without complication ([place])   ? GERD (gastroesophageal reflux disease)   ? CONTROLLED  ? Hiatal hernia   ? Hyperlipidemia   ? Hypertension   ? Irritable bowel syndrome   ? PAST  ? LFTs abnormal   ? Migraines   ? PT STATES SINUS HA NOT MIGRAINES  ? Seizures ([place])   ? FEBRILE SEIZURES UNDER 2- NONE SINCE LESS THAN AGE 2  ? ? ?Past Surgical History:  ?Procedure Laterality Date  ? COLONOSCOPY  2007  ? LASIK    ? nsvd  2011  ? x1 with Epidural  ? UPPER GASTROINTESTINAL ENDOSCOPY  2005  ? WISDOM TOOTH EXTRACTION    ? ? ?Current Medications: ?No outpatient medications have been marked as taking for the 06/06/21 encounter (Appointment) with Freada Bergeron, MD.  ?  ? ?Allergies:   Penicillins and Sulfonamide derivatives  ? ?Social History  ? ?Socioeconomic History  ? Marital status: Married  ?  Spouse name: Not on file  ? Number of children: 1  ? Years of education: Not on file  ? Highest education level: Not on file  ?Occupational History  ? Occupation: Pharmacist, hospital  ?  Employer: [employer]  ?Tobacco Use  ? Smoking status: Never  ? Smokeless tobacco: Never  ?Vaping Use  ? Vaping Use: Never used  ?Substance and Sexual Activity  ? Alcohol use: Yes  ?  Comment: occas- rare- weddings etc  ? Drug use: Never  ? Sexual activity: Yes  ?  Birth control/protection: None   ?Other Topics Concern  ? Not on file  ?Social History Narrative  ? Regular exercise: no  ? Consults  ? Dr Olevia Perches-- GI  ? Dr Ronita Hipps-- GYN  ? HHof  51  ? 74 month old trying to get out of crib.   ? [place]  To [place]  ?   ?   ?   ?   ?   ? ?Social Determinants of Health  ? ?Financial Resource Strain: Not on file  ?Food Insecurity: Not on file  ?Transportation Needs: Not on file  ?Physical Activity: Not on file  ?Stress: Not on file  ?Social Connections: Not on file  ?  ? ?Family History: ?The patient's ***family history includes Arthritis in her sister; Breast cancer in her paternal grandmother; Colon cancer in her maternal grandmother; Colon polyps in her mother; Diabetes in her maternal grandfather; Heart disease in her maternal grandmother; Hyperlipidemia in her father; Hypertension in her mother; Kidney disease in her maternal grandmother; Other in her mother; Ovarian cancer in her maternal grandmother; Stroke in her paternal grandmother. There is no history of Esophageal cancer, Rectal cancer, or Stomach cancer. ? ?ROS:   ?Please see  the history of present illness.    ?*** All other systems reviewed and are negative. ? ?EKGs/Labs/Other Studies Reviewed:   ? ?The following studies were reviewed today: ?TTE OSH 05/2019: ?Summary  ?  1. Technically difficult study due to body habitus.  ?  2. The left ventricle is normal in size with normal wall thickness.  ?  3. The left ventricular systolic function is normal, LVEF is visually  ?estimated at 55-60%.  ?  4. There is grade I diastolic dysfunction (impaired relaxation).  ?  5. The left atrium is mildly dilated in size.  ?  6. The right ventricle is normal in size, with normal systolic function.  ? ? ?Left Ventricle  ?  The left ventricle is normal in size with normal wall thickness.  ?  The left ventricular systolic function is normal, LVEF is visually estimated  ?at 55-60%.  ?  There is grade I diastolic dysfunction (impaired relaxation).  ? ?Right Ventricle  ?   The right ventricle is normal in size, with normal systolic function.  ? ? ?Left Atrium  ?  The left atrium is mildly dilated in size.  ? ?Right Atrium  ?  The right atrium is normal  in size.  ? ? ?Aortic Valve  ?  The aortic valve is trileaflet with normal appearing leaflets with normal  ?excursion.  ?  There is no significant aortic regurgitation.  ?  There is no evidence of a significant transvalvular gradient.  ? ?Pulmonic Valve  ?  The pulmonic valve is normal.  ?  There is no significant pulmonic regurgitation.  ?  There is no evidence of a significant transvalvular gradient.  ? ?Mitral Valve  ?  The mitral valve leaflets are normal with normal leaflet mobility.  ?  There is no significant mitral valve regurgitation.  ? ?Tricuspid Valve  ?  The tricuspid valve leaflets are normal, with normal leaflet mobility.  ?  There is no significant tricuspid regurgitation.  ?  Pulmonary systolic pressure cannot be estimated due to insufficient TR jet.   ? ?EKG:  EKG is *** ordered today.  The ekg ordered today demonstrates *** ? ?Recent Labs: ?No results found for requested labs within last 8760 hours.  ?Recent Lipid Panel ?   ?Component Value Date/Time  ? CHOL 222 (H) 09/26/2011 1610  ? TRIG 211.0 (H) 09/26/2011 9604  ? HDL 49.70 09/26/2011 0927  ? CHOLHDL 4 09/26/2011 0927  ? VLDL 42.2 (H) 09/26/2011 5409  ? LDLDIRECT 146.4 09/26/2011 0927  ? ? ? ?Risk Assessment/Calculations:   ?{Does this patient have ATRIAL FIBRILLATION?[phone]} ? ?    ? ?Physical Exam:   ? ?VS:  There were no vitals taken for this visit.   ? ?Wt Readings from Last 3 Encounters:  ?05/21/21 190 lb (86.2 kg)  ?05/08/21 190 lb (86.2 kg)  ?06/08/13 182 lb 8 oz (82.8 kg)  ?  ? ?GEN: *** Well nourished, well developed in no acute distress ?HEENT: Normal ?NECK: No JVD; No carotid bruits ?LYMPHATICS: No lymphadenopathy ?CARDIAC: ***RRR, no murmurs, rubs, gallops ?RESPIRATORY:  Clear to auscultation without rales, wheezing or rhonchi  ?ABDOMEN: Soft,  non-tender, non-distended ?MUSCULOSKELETAL:  No edema; No deformity  ?SKIN: Warm and dry ?NEUROLOGIC:  Alert and oriented x 3 ?PSYCHIATRIC:  Normal affect  ? ?ASSESSMENT:   ? ?No diagnosis found. ?PLAN:   ? ?In order of problems listed above: ? ?#SVT: ?TTE 05/2019 at OSH with EF 55-60%, G1DD, mild LAE, normal RV,  no significant valve disease.  ? ?#HTN: ?-Continue benxapril-HCTZ 20-12.5mg  daily ? ?#HLD: ?-Continue lipitor 40mg  daily ?-Check Ca score ? ?   ? ?{Are you ordering a CV Procedure (e.g. stress test, cath, DCCV, TEE, etc)?   Press F2        :[id number]}  ? ? ?Medication Adjustments/Labs and Tests Ordered: ?Current medicines are reviewed at length with the patient today.  Concerns regarding medicines are outlined above.  ?No orders of the defined types were placed in this encounter. ? ?No orders of the defined types were placed in this encounter. ? ? ?There are no Patient Instructions on file for this visit.  ? ?Signed, ?Freada Bergeron, MD  ?06/03/2021 8:05 PM    ?[place] ?

## 2021-06-06 ENCOUNTER — Ambulatory Visit: Payer: BLUE CROSS/BLUE SHIELD | Admitting: Cardiology

## 2021-06-06 ENCOUNTER — Ambulatory Visit (INDEPENDENT_AMBULATORY_CARE_PROVIDER_SITE_OTHER): Payer: BLUE CROSS/BLUE SHIELD

## 2021-06-06 ENCOUNTER — Other Ambulatory Visit: Payer: Self-pay

## 2021-06-06 ENCOUNTER — Encounter: Payer: Self-pay | Admitting: Cardiology

## 2021-06-06 VITALS — BP 120/82 | HR 91 | Ht 63.0 in | Wt 193.4 lb

## 2021-06-06 DIAGNOSIS — E669 Obesity, unspecified: Secondary | ICD-10-CM | POA: Diagnosis not present

## 2021-06-06 DIAGNOSIS — R079 Chest pain, unspecified: Secondary | ICD-10-CM | POA: Diagnosis not present

## 2021-06-06 DIAGNOSIS — R002 Palpitations: Secondary | ICD-10-CM | POA: Diagnosis not present

## 2021-06-06 DIAGNOSIS — Z6834 Body mass index (BMI) 34.0-34.9, adult: Secondary | ICD-10-CM

## 2021-06-06 DIAGNOSIS — Z8249 Family history of ischemic heart disease and other diseases of the circulatory system: Secondary | ICD-10-CM | POA: Diagnosis not present

## 2021-06-06 NOTE — Progress Notes (Signed)
Cardiology Office Note:    Date:  06/06/2021   ID:  Priscilla Harris, DOB September 12, 1970, MRN [id number]  PCP:  Neale Burly, MD   [facility] Providers Cardiologist:  None {  Referring MD: Neale Burly, MD   History of Present Illness:    Priscilla Harris is a 51 y.o. female with a hx of autoimmune hepatitis, DMII, GERD, HTN, HLD, and IBS who was referred by Dr. Sherrie Sport for further evaluation of SVT.  Today, she is doing well. She reports that she has been having episodes of rapid HR. The palpitations occur every other day and last for a few minutes before resolving. She has associated chest heaviness and labored breathing. The palpitations and chest discomfort can occur with stress or exertion. Walking up the stairs or sudden changes at work can trigger her palpitations. However, her smart watch will notify her of her heart racing while she is resting.   She used to wake up 2 to 3 times at night. She endorses having undergone a sleepy study. She wonders if losing weight will help with her sleep quality. She was given the option of a CPAP or a mouth guard.   In the past, she was diagnosed with autoimmune hepatitis. For 3 years, she was taking prednisone and CellCept. She stopped those medications in 2017 when she was pregnant and had a miscarriage. At the time, she was followed by Dr. Corliss Parish. Since then, she has noticed her health has been difficult to maintain.   She reports having a good diet and stays active by walking daily. She teaches middle school. She endorses a family history of "heart issues" on both sides of her family. She is tolerating her benazepril-HCTZ, Matzim, and metformin. She tried the keto diet but had more frequent palpitations and therefore stopped. She is having trouble losing weight and is open to trying Ozempic.   The patient denies PND, orthopnea, or leg swelling. Denies cough, fever, chills. Denies nausea, vomiting. Denies syncope or presyncope. Denies  dizziness or lightheadedness.   Past Medical History:  Diagnosis Date   Allergy    SEASONAL   Arthritis    OA   Autoimmune hepatitis ([place])    2015-2017   Barrett's esophagus    PT STATES DR Olevia Perches STATED MISDIAGNOSED WITH BARRETT'S   Diabetes mellitus without complication ([facility])    GERD (gastroesophageal reflux disease)    CONTROLLED   Hiatal hernia    Hyperlipidemia    Hypertension    Irritable bowel syndrome    PAST   LFTs abnormal    Migraines    PT STATES SINUS HA NOT MIGRAINES   Seizures ([place])    FEBRILE SEIZURES UNDER 2- NONE SINCE LESS THAN AGE 37    Past Surgical History:  Procedure Laterality Date   COLONOSCOPY  2007   LASIK     nsvd  2011   x1 with Epidural   UPPER GASTROINTESTINAL ENDOSCOPY  2005   WISDOM TOOTH EXTRACTION      Current Medications: Current Meds  Medication Sig   acyclovir (ZOVIRAX) 400 MG tablet Take by mouth.   atorvastatin (LIPITOR) 40 MG tablet Take 40 mg by mouth at bedtime.   benazepril-hydrochlorthiazide (LOTENSIN HCT) 20-12.5 MG tablet Take 1 tablet by mouth daily.   cetirizine (ZYRTEC) 10 MG tablet Take 10 mg by mouth daily.   cholecalciferol (VITAMIN D3) 25 MCG (1000 UNIT) tablet Take 1,000 Units by mouth daily. D 3   Cyanocobalamin (VITAMIN B 12 PO)  Take by mouth.   diclofenac Sodium (VOLTAREN) 1 % GEL Apply topically 4 (four) times daily.   Grape Seed Extract 100 MG CAPS Take 200 mg by mouth daily.   KRILL OIL PO Take 800 mg by mouth daily.   MATZIM LA 300 MG 24 hr tablet Take 300 mg by mouth daily.   metFORMIN (GLUCOPHAGE) 500 MG tablet Take 500 mg by mouth daily.   omeprazole (PRILOSEC) 20 MG capsule Take 20 mg by mouth daily.   Triamcinolone Acetonide (NASACORT AQ NA) Place into the nose.     Allergies:   Penicillins and Sulfonamide derivatives   Social History   Socioeconomic History   Marital status: Married    Spouse name: Not on file   Number of children: 1   Years of education: Not on file   Highest education  level: Not on file  Occupational History   Occupation: Product manager: [employer]  Tobacco Use   Smoking status: Never   Smokeless tobacco: Never  Vaping Use   Vaping Use: Never used  Substance and Sexual Activity   Alcohol use: Yes    Comment: occas- rare- weddings etc   Drug use: Never   Sexual activity: Yes    Birth control/protection: None  Other Topics Concern   Not on file  Social History Narrative   Regular exercise: no   Consults   Dr Olevia Perches-- GI   Dr Ronita Hipps-- GYN   HHof  63   63 month old trying to get out of crib.    [place]  To [place]                  Social Determinants of Health   Financial Resource Strain: Not on file  Food Insecurity: Not on file  Transportation Needs: Not on file  Physical Activity: Not on file  Stress: Not on file  Social Connections: Not on file     Family History: The patient's family history includes Arthritis in her sister; Breast cancer in her paternal grandmother; Colon cancer in her maternal grandmother; Colon polyps in her mother; Diabetes in her maternal grandfather; Heart disease in her maternal grandmother; Hyperlipidemia in her father; Hypertension in her mother; Kidney disease in her maternal grandmother; Other in her mother; Ovarian cancer in her maternal grandmother; Stroke in her paternal grandmother. There is no history of Esophageal cancer, Rectal cancer, or Stomach cancer.  ROS:   Please see the history of present illness.    Review of Systems  Constitutional:  Positive for malaise/fatigue. Negative for weight loss.  HENT:  Negative for congestion and sore throat.   Eyes:  Negative for pain.  Respiratory:  Positive for shortness of breath. Negative for cough.   Cardiovascular:  Positive for chest pain and palpitations. Negative for orthopnea, claudication, leg swelling and PND.  Gastrointestinal:  Negative for heartburn and nausea.  Genitourinary:  Negative for frequency and urgency.   Musculoskeletal:  Negative for joint pain and myalgias.  Skin:  Negative for itching and rash.  Neurological:  Negative for dizziness and headaches.  Endo/Heme/Allergies:  Does not bruise/bleed easily.  Psychiatric/Behavioral:  The patient is nervous/anxious. The patient does not have insomnia.   All other systems reviewed and are negative.  EKGs/Labs/Other Studies Reviewed:    The following studies were reviewed today: TTE OSH 05/2019: Summary    1. Technically difficult study due to body habitus.    2. The left ventricle is normal in size with normal wall  thickness.    3. The left ventricular systolic function is normal, LVEF is visually  estimated at 55-60%.    4. There is grade I diastolic dysfunction (impaired relaxation).    5. The left atrium is mildly dilated in size.    6. The right ventricle is normal in size, with normal systolic function.    Left Ventricle    The left ventricle is normal in size with normal wall thickness.    The left ventricular systolic function is normal, LVEF is visually estimated  at 55-60%.    There is grade I diastolic dysfunction (impaired relaxation).   Right Ventricle    The right ventricle is normal in size, with normal systolic function.    Left Atrium    The left atrium is mildly dilated in size.   Right Atrium    The right atrium is normal  in size.    Aortic Valve    The aortic valve is trileaflet with normal appearing leaflets with normal  excursion.    There is no significant aortic regurgitation.    There is no evidence of a significant transvalvular gradient.   Pulmonic Valve    The pulmonic valve is normal.    There is no significant pulmonic regurgitation.    There is no evidence of a significant transvalvular gradient.   Mitral Valve    The mitral valve leaflets are normal with normal leaflet mobility.    There is no significant mitral valve regurgitation.   Tricuspid Valve    The tricuspid valve leaflets are  normal, with normal leaflet mobility.    There is no significant tricuspid regurgitation.    Pulmonary systolic pressure cannot be estimated due to insufficient TR jet.    EKG:   06/06/21: Sinus rhythm, rate 91 bpm  Recent Labs: No results found for requested labs within last 8760 hours.  Recent Lipid Panel    Component Value Date/Time   CHOL 222 (H) 09/26/2011 0927   TRIG 211.0 (H) 09/26/2011 0927   HDL 49.70 09/26/2011 0927   CHOLHDL 4 09/26/2011 0927   VLDL 42.2 (H) 09/26/2011 0927   LDLDIRECT 146.4 09/26/2011 0927           Physical Exam:    VS:  BP 120/82    Pulse 91    Ht 5\' 3"  (1.6 m)    Wt 193 lb 6.4 oz (87.7 kg)    SpO2 96%    BMI 34.26 kg/m     Wt Readings from Last 3 Encounters:  06/06/21 193 lb 6.4 oz (87.7 kg)  05/21/21 190 lb (86.2 kg)  05/08/21 190 lb (86.2 kg)     GEN: Well nourished, well developed in no acute distress HEENT: Normal NECK: No JVD; No carotid bruits CARDIAC: RRR, 1/6 systolic murmur, no rubs or gallops RESPIRATORY:  Clear to auscultation without rales, wheezing or rhonchi  ABDOMEN: Soft, non-tender, non-distended MUSCULOSKELETAL:  No edema; No deformity  SKIN: Warm and dry NEUROLOGIC:  Alert and oriented x 3 PSYCHIATRIC:  Normal affect   ASSESSMENT:    1. Palpitations   2. Chest pain of uncertain etiology   3. Family history of early CAD   7. Class 1 obesity without serious comorbidity with body mass index (BMI) of 34.0 to 34.9 in adult, unspecified obesity type    PLAN:    In order of problems listed above:  #Palpitations: Patient with frequent episodes of palpitations with associated SOB and chest discomfort. Symptoms can occur with exertion  and at rest with no known triggers. Currently on diltiazem (was started for blood pressure) with persistent symptoms. TTE 05/2019 at OSH with EF 55-60%, G1DD, mild LAE, normal RV, no significant valve disease.  -Will check 7 day zio -Pending work-up, can start bystolic as her mother  responded well to this  #HTN: -Continue benxapril-HCTZ 20-12.5mg  daily -Continue dilt 300mg  daily  #HLD: #Family History of CAD: -Continue lipitor 40mg  daily -Check Ca score  #Obesity: BMI 34. Patient has tried extensive diet and exercise without much improvement. Will look into GLP-1 agonist.            Medication Adjustments/Labs and Tests Ordered: Current medicines are reviewed at length with the patient today.  Concerns regarding medicines are outlined above.  Orders Placed This Encounter  Procedures   CT CARDIAC SCORING (SELF PAY ONLY)   LONG TERM MONITOR (3-14 DAYS)   EKG 12-Lead   No orders of the defined types were placed in this encounter.   Patient Instructions  Medication Instructions:   Your physician recommends that you continue on your current medications as directed. Please refer to the Current Medication list given to you today.  *If you need a refill on your cardiac medications before your next appointment, please call your pharmacy*   Testing/Procedures:  CARDIAC CALCIUM SCORE (SELF PAY) TO BE DONE HERE IN THE OFFICE   [facility] Monitor Instructions  Your physician has requested you wear a ZIO patch monitor for 7 days.  This is a single patch monitor. [employer] supplies one patch monitor per enrollment. Additional stickers are not available. Please do not apply patch if you will be having a Nuclear Stress Test,  Echocardiogram, Cardiac CT, MRI, or Chest Xray during the period you would be wearing the  monitor. The patch cannot be worn during these tests. You cannot remove and re-apply the  ZIO XT patch monitor.  Your ZIO patch monitor will be mailed 3 day USPS to your address on file. It may take 3-5 days  to receive your monitor after you have been enrolled.  Once you have received your monitor, please review the enclosed instructions. Your monitor  has already been registered assigning a specific monitor serial # to you.  Billing and  Patient Assistance Program Information  We have supplied [employer] with any of your insurance information on file for billing purposes. [employer] offers a sliding scale Patient Assistance Program for patients that do not have  insurance, or whose insurance does not completely cover the cost of the ZIO monitor.  You must apply for the Patient Assistance Program to qualify for this discounted rate.  To apply, please call [employer] at [phone], select option 4, select option 2, ask to apply for  Patient Assistance Program. Theodore Demark will ask your household income, and how many people  are in your household. They will quote your out-of-pocket cost based on that information.  [employer] will also be able to set up a 11-month, interest-free payment plan if needed.  Applying the monitor   Shave hair from upper left chest.  Hold abrader disc by orange tab. Rub abrader in 40 strokes over the upper left chest as  indicated in your monitor instructions.  Clean area with 4 enclosed alcohol pads. Let dry.  Apply patch as indicated in monitor instructions. Patch will be placed under collarbone on left  side of chest with arrow pointing upward.  Rub patch adhesive wings for 2 minutes. Remove white label marked "1". Remove the white  label marked "2". Rub patch adhesive wings for 2 additional minutes.  While looking in a mirror, press and release button in center of patch. A small green light will  flash 3-4 times. This will be your only indicator that the monitor has been turned on.  Do not shower for the first 24 hours. You may shower after the first 24 hours.  Press the button if you feel a symptom. You will hear a small click. Record Date, Time and  Symptom in the Patient Logbook.  When you are ready to remove the patch, follow instructions on the last 2 pages of Patient  Logbook. Stick patch monitor onto the last page of Patient Logbook.  Place Patient Logbook in the blue and white box. Use locking tab on  box and tape box closed  securely. The blue and white box has prepaid postage on it. Please place it in the mailbox as  soon as possible. Your physician should have your test results approximately 7 days after the  monitor has been mailed back to [facility].  Call [facility] at [phone] if you have questions regarding  your ZIO XT patch monitor. Call them immediately if you see an orange light blinking on your  monitor.  If your monitor falls off in less than 4 days, contact our Monitor department at [phone].  If your monitor becomes loose or falls off after 4 days call [employer] at [phone] for  suggestions on securing your monitor   Follow-Up: At [facility], you and your health needs are our priority.  As part of our continuing mission to provide you with exceptional heart care, we have created designated Provider Care Teams.  These Care Teams include your primary Cardiologist (physician) and Advanced Practice Providers (APPs -  Physician Assistants and Nurse Practitioners) who all work together to provide you with the care you need, when you need it.  We recommend signing up for the patient portal called "MyChart".  Sign up information is provided on this After Visit Summary.  MyChart is used to connect with patients for Virtual Visits (Telemedicine).  Patients are able to view lab/test results, encounter notes, upcoming appointments, etc.  Non-urgent messages can be sent to your provider as well.   To learn more about what you can do with MyChart, go to [email or web address].    Your next appointment:   6 month(s)  The format for your next appointment:   In Person  Provider:   DR. Reece Leader as a scribe for Freada Bergeron, MD.,have documented all relevant documentation on the behalf of Freada Bergeron, MD,as directed by  Freada Bergeron, MD while in the presence of Freada Bergeron, MD.  I, Freada Bergeron, MD, have reviewed all documentation for this visit. The documentation on 06/06/21 for the exam, diagnosis, procedures, and orders are all accurate and complete.   Signed, Freada Bergeron, MD  06/06/2021 3:43 PM    [place]

## 2021-06-06 NOTE — Patient Instructions (Signed)
Medication Instructions:  ? ?Your physician recommends that you continue on your current medications as directed. Please refer to the Current Medication list given to you today. ? ?*If you need a refill on your cardiac medications before your next appointment, please call your pharmacy* ? ? ?Testing/Procedures: ? ?CARDIAC CALCIUM SCORE (SELF PAY) TO BE DONE HERE IN THE OFFICE ? ? ?ZIO XT- Long Term Monitor Instructions ? ?Your physician has requested you wear a ZIO patch monitor for 7 days.  ?This is a single patch monitor. [employer] supplies one patch monitor per enrollment. Additional ?stickers are not available. Please do not apply patch if you will be having a Nuclear Stress Test,  ?Echocardiogram, Cardiac CT, MRI, or Chest Xray during the period you would be wearing the  ?monitor. The patch cannot be worn during these tests. You cannot remove and re-apply the  ?ZIO XT patch monitor.  ?Your ZIO patch monitor will be mailed 3 day USPS to your address on file. It may take 3-5 days  ?to receive your monitor after you have been enrolled.  ?Once you have received your monitor, please review the enclosed instructions. Your monitor  ?has already been registered assigning a specific monitor serial # to you. ? ?Billing and Patient Assistance Program Information ? ?We have supplied [employer] with any of your insurance information on file for billing purposes. ?[employer] offers a sliding scale Patient Assistance Program for patients that do not have  ?insurance, or whose insurance does not completely cover the cost of the ZIO monitor.  ?You must apply for the Patient Assistance Program to qualify for this discounted rate.  ?To apply, please call [employer] at [phone], select option 4, select option 2, ask to apply for  ?Patient Assistance Program. Theodore Demark will ask your household income, and how many people  ?are in your household. They will quote your out-of-pocket cost based on that information.  ?[employer] will also be able to  set up a 58-month, interest-free payment plan if needed. ? ?Applying the monitor ?  ?Shave hair from upper left chest.  ?Hold abrader disc by orange tab. Rub abrader in 40 strokes over the upper left chest as  ?indicated in your monitor instructions.  ?Clean area with 4 enclosed alcohol pads. Let dry.  ?Apply patch as indicated in monitor instructions. Patch will be placed under collarbone on left  ?side of chest with arrow pointing upward.  ?Rub patch adhesive wings for 2 minutes. Remove white label marked "1". Remove the white  ?label marked "2". Rub patch adhesive wings for 2 additional minutes.  ?While looking in a mirror, press and release button in center of patch. A small green light will  ?flash 3-4 times. This will be your only indicator that the monitor has been turned on.  ?Do not shower for the first 24 hours. You may shower after the first 24 hours.  ?Press the button if you feel a symptom. You will hear a small click. Record Date, Time and  ?Symptom in the Patient Logbook.  ?When you are ready to remove the patch, follow instructions on the last 2 pages of Patient  ?Logbook. Stick patch monitor onto the last page of Patient Logbook.  ?Place Patient Logbook in the blue and white box. Use locking tab on box and tape box closed  ?securely. The blue and white box has prepaid postage on it. Please place it in the mailbox as  ?soon as possible. Your physician should have your test results approximately 7 days  after the  ?monitor has been mailed back to [place].  ?Call [facility] at [phone] if you have questions regarding  ?your ZIO XT patch monitor. Call them immediately if you see an orange light blinking on your  ?monitor.  ?If your monitor falls off in less than 4 days, contact our Monitor department at [phone].  ?If your monitor becomes loose or falls off after 4 days call [employer] at [phone] for  ?suggestions on securing your monitor ? ? ?Follow-Up: ?At [facility], you and your health needs are our priority.  As part of our continuing mission to provide you with exceptional heart care, we have created designated Provider Care Teams.  These Care Teams include your primary Cardiologist (physician) and Advanced Practice Providers (APPs -  Physician Assistants and Nurse Practitioners) who all work together to provide you with the care you need, when you need it. ? ?We recommend signing up for the patient portal called "MyChart".  Sign up information is provided on this After Visit Summary.  MyChart is used to connect with patients for Virtual Visits (Telemedicine).  Patients are able to view lab/test results, encounter notes, upcoming appointments, etc.  Non-urgent messages can be sent to your provider as well.   ?To learn more about what you can do with MyChart, go to [email or web address].   ? ?Your next appointment:   ?6 month(s) ? ?The format for your next appointment:   ?In Person ? ?Provider:   ?DR. PEMBERTON ? ?

## 2021-06-06 NOTE — Progress Notes (Unsigned)
Enrolled for [employer] to mail a ZIO XT long term holter monitor to the patients address on file.  

## 2021-06-08 DIAGNOSIS — R002 Palpitations: Secondary | ICD-10-CM

## 2021-06-12 ENCOUNTER — Telehealth: Payer: Self-pay | Admitting: Student-PharmD

## 2021-06-12 MED ORDER — OZEMPIC (0.25 OR 0.5 MG/DOSE) 2 MG/1.5ML ~~LOC~~ SOPN: PEN_INJECTOR | SUBCUTANEOUS | 1 refills | Status: DC

## 2021-06-12 NOTE — Telephone Encounter (Signed)
Received referral from Dr. Johney Frame to start semaglutide for this patient for weight loss. They meet FDA approved criteria for semaglutide for use in obesity given BMI 34.26. Obesity is complicated by chronic conditions including T2DM, HLD. No contraindications seen in the chart to [facility] use.  ? ?Submitted PA for [facility] but it did not submit, saying it is not covered by the plan. Since the patient has T2DM, submitted PA for Ozempic which did not require authorization as it is on formulary.   ? ?Most recent labs from 05/02/21 per [facility]:  ?A1c 6.7 ?LDL 87 ?TG 163 ? ?Called patient who is very interested in starting Ozempic. Confirmed she has no personal or family history of medullary thyroid carcinoma. Discussed administration, storage, dose titration, and possible adverse effects. Sent Rx to her pharmacy. Scheduled for pharmacist appointment on 07/04/21.  ?

## 2021-07-04 ENCOUNTER — Ambulatory Visit: Payer: BLUE CROSS/BLUE SHIELD

## 2021-07-17 ENCOUNTER — Ambulatory Visit (INDEPENDENT_AMBULATORY_CARE_PROVIDER_SITE_OTHER)
Admission: RE | Admit: 2021-07-17 | Discharge: 2021-07-17 | Disposition: A | Discharge status: Home / Self Care | Payer: Self-pay | Source: Ambulatory Visit | Attending: Cardiology | Admitting: Cardiology

## 2021-07-17 DIAGNOSIS — R079 Chest pain, unspecified: Secondary | ICD-10-CM

## 2021-07-17 DIAGNOSIS — Z8249 Family history of ischemic heart disease and other diseases of the circulatory system: Secondary | ICD-10-CM

## 2021-07-18 ENCOUNTER — Other Ambulatory Visit: Payer: Self-pay | Admitting: *Deleted

## 2021-07-18 DIAGNOSIS — Z8249 Family history of ischemic heart disease and other diseases of the circulatory system: Secondary | ICD-10-CM

## 2021-07-26 ENCOUNTER — Telehealth: Payer: Self-pay | Admitting: Cardiology

## 2021-07-26 DIAGNOSIS — Z79899 Other long term (current) drug therapy: Secondary | ICD-10-CM

## 2021-07-26 DIAGNOSIS — Z8249 Family history of ischemic heart disease and other diseases of the circulatory system: Secondary | ICD-10-CM

## 2021-07-26 NOTE — Telephone Encounter (Signed)
Lipid panel order replaced and under lab collect for [facility] to draw. ?Order was released. ?Pt aware and gracious for all the assistance provided. ?

## 2021-07-26 NOTE — Telephone Encounter (Signed)
Patient called to have labs released at [facility] in [place], [place]. Nurse was able to release labs ?

## 2021-07-27 LAB — LIPID PANEL
Chol/HDL Ratio: 3.3 ratio (ref 0.0–4.4)
Cholesterol, Total: 166 mg/dL (ref 100–199)
HDL: 51 mg/dL (ref >39)
LDL Chol Calc (NIH): 87 mg/dL (ref 0–99)
Triglycerides: 163 mg/dL — ABNORMAL HIGH (ref 0–149)
VLDL Cholesterol Cal: 28 mg/dL (ref 5–40)

## 2021-07-30 ENCOUNTER — Telehealth: Payer: Self-pay | Admitting: Pharmacist

## 2021-07-30 ENCOUNTER — Other Ambulatory Visit: Payer: Self-pay

## 2021-07-30 ENCOUNTER — Telehealth: Payer: Self-pay

## 2021-07-30 DIAGNOSIS — Z8249 Family history of ischemic heart disease and other diseases of the circulatory system: Secondary | ICD-10-CM

## 2021-07-30 DIAGNOSIS — Z79899 Other long term (current) drug therapy: Secondary | ICD-10-CM

## 2021-07-30 MED ORDER — OZEMPIC (0.25 OR 0.5 MG/DOSE) 2 MG/1.5ML ~~LOC~~ SOPN: PEN_INJECTOR | SUBCUTANEOUS | 1 refills | Status: DC

## 2021-07-30 MED ORDER — EZETIMIBE 10 MG PO TABS: 10.0000 mg | ORAL_TABLET | Freq: Every day | ORAL | 3 refills | Status: DC

## 2021-07-30 NOTE — Telephone Encounter (Signed)
From: Freada Bergeron, MD  ?Sent: 07/30/2021   8:22 AM EDT  ?To: Nuala Alpha, LPN  ? ?Her cholesterol is slightly above goal (goal LDL<70). Can we start zetia '10mg'$  and repeat lipids in 6-8weeks?  ?

## 2021-07-30 NOTE — Telephone Encounter (Signed)
Patient canceled her PharmD apt for Ozempic. She has been giving herself the injections. Has taken 4 injections of 0.'25mg'$ . I advised that we usually like them to come in to discuss diet/lifestyle. She states that she has changes her diet, tracking her foods. More fruits and vegetables, lean meats, red meat once a week. ?Educated her on how to increase dose to 0.'5mg'$ . Will call pt in 1 month to f/u. ?

## 2021-08-20 MED ORDER — OZEMPIC (1 MG/DOSE) 4 MG/3ML ~~LOC~~ SOPN: 1.0000 mg | PEN_INJECTOR | SUBCUTANEOUS | 0 refills | Status: DC

## 2021-08-20 NOTE — Addendum Note (Signed)
Addended by: Marcelle Overlie D on: 08/20/2021 01:17 PM ? ? Modules accepted: Orders ? ?

## 2021-08-20 NOTE — Telephone Encounter (Signed)
Called pt. Doing well on ozempic 0.'5mg'$  weekly. Ready to increase to '1mg'$  weekly. Rx sent to pharmacy. Will call pt in 4 weeks to follow up. ?

## 2021-09-13 ENCOUNTER — Telehealth: Payer: Self-pay | Admitting: Pharmacist

## 2021-09-13 NOTE — Telephone Encounter (Signed)
Called pt to follow up with tolerability of Ozempic '1mg'$  weekly and left message. If tolerating well, will plan to increase to '2mg'$  weekly maintenance dose.

## 2021-09-17 ENCOUNTER — Telehealth: Payer: Self-pay | Admitting: Pharmacist

## 2021-09-17 NOTE — Telephone Encounter (Signed)
Patient is returning call regarding Megan RPH-CPP voice message on 6/8.

## 2021-09-18 MED ORDER — OZEMPIC (2 MG/DOSE) 8 MG/3ML ~~LOC~~ SOPN: 2.0000 mg | PEN_INJECTOR | SUBCUTANEOUS | 11 refills | Status: DC

## 2021-09-18 NOTE — Addendum Note (Signed)
Addended by: Marcelle Overlie D on: 09/18/2021 11:52 AM   Modules accepted: Orders

## 2021-09-18 NOTE — Telephone Encounter (Signed)
Called pt. States she is down to 177lb. No issues with medication except a salty taste in her mouth and a little nausea a few morning after taking. She would like to increase to '2mg'$ . Is trying to stick with low sugar diet and more fruits and vegetables.

## 2021-09-19 NOTE — Telephone Encounter (Signed)
See encounter from 6/8

## 2021-10-17 ENCOUNTER — Telehealth: Payer: Self-pay | Admitting: Pharmacist

## 2021-10-17 MED ORDER — SEMAGLUTIDE (1 MG/DOSE) 4 MG/3ML ~~LOC~~ SOPN: 1.0000 mg | PEN_INJECTOR | SUBCUTANEOUS | 11 refills | Status: DC

## 2021-10-17 NOTE — Telephone Encounter (Signed)
Patient called stating that she has vomited for the first 3 days after her ozempic '2mg'$  injection for the last 3 weeks. Wondering if she should go back to the '1mg'$ . Advised to decreased back to '1mg'$ . I sent new Rx to her pharmacy.

## 2021-12-04 ENCOUNTER — Ambulatory Visit: Payer: BLUE CROSS/BLUE SHIELD | Admitting: Cardiology

## 2022-01-03 NOTE — Progress Notes (Deleted)
Cardiology Office Note:    Date:  01/03/2022   ID:  Priscilla Harris, DOB [birth year], MRN [id number]  PCP:  Neale Burly, MD   [facility] Providers Cardiologist:  None {  Referring MD: Neale Burly, MD   History of Present Illness:    Priscilla Harris is a 51 y.o. female with a hx of autoimmune hepatitis, DMII, GERD, HTN, HLD,  and IBS who presents to clinic for follow-up.  Patient was initially seen on 06/2021 where she was having episodes of palpitations. Zio monitor 06/20/21 showed NSR with rare SVE. Ca score for risk stratification showed score of 237 which was the 99% for age, gender, race matched controls. She was continued on lipitor '40mg'$  daily at that time.  Today, ***   Past Medical History:  Diagnosis Date   Allergy    SEASONAL   Arthritis    OA   Autoimmune hepatitis ([place])    2015-2017   Barrett's esophagus    PT STATES DR Olevia Perches STATED MISDIAGNOSED WITH BARRETT'S   Diabetes mellitus without complication (HCC)    GERD (gastroesophageal reflux disease)    CONTROLLED   Hiatal hernia    Hyperlipidemia    Hypertension    Irritable bowel syndrome    PAST   LFTs abnormal    Migraines    PT STATES SINUS HA NOT MIGRAINES   Seizures ([place])    FEBRILE SEIZURES UNDER 2- NONE SINCE LESS THAN AGE 75    Past Surgical History:  Procedure Laterality Date   COLONOSCOPY  2007   LASIK     nsvd  2011   x1 with Epidural   UPPER GASTROINTESTINAL ENDOSCOPY  2005   WISDOM TOOTH EXTRACTION      Current Medications: No outpatient medications have been marked as taking for the 01/07/22 encounter (Appointment) with Freada Bergeron, MD.     Allergies:   Penicillins and Sulfonamide derivatives   Social History   Socioeconomic History   Marital status: Married    Spouse name: Not on file   Number of children: 1   Years of education: Not on file   Highest education level: Not on file  Occupational History   Occupation: Product manager: [employer]  Tobacco Use   Smoking status: Never   Smokeless tobacco: Never  Vaping Use   Vaping Use: Never used  Substance and Sexual Activity   Alcohol use: Yes    Comment: occas- rare- weddings etc   Drug use: Never   Sexual activity: Yes    Birth control/protection: None  Other Topics Concern   Not on file  Social History Narrative   Regular exercise: no   Consults   Dr Olevia Perches-- GI   Dr Ronita Hipps-- [place]  64   85 month old trying to get out of crib.    [place]  To [place]                  Social Determinants of Health   Financial Resource Strain: Not on file  Food Insecurity: Not on file  Transportation Needs: Not on file  Physical Activity: Not on file  Stress: Not on file  Social Connections: Not on file     Family History: The patient's family history includes Arthritis in her sister; Breast cancer in her paternal grandmother; Colon cancer in her maternal grandmother; Colon polyps in her mother; Diabetes in her maternal grandfather; Heart disease in her maternal grandmother;  Hyperlipidemia in her father; Hypertension in her mother; Kidney disease in her maternal grandmother; Other in her mother; Ovarian cancer in her maternal grandmother; Stroke in her paternal grandmother. There is no history of Esophageal cancer, Rectal cancer, or Stomach cancer.  ROS:   Please see the history of present illness.    Review of Systems  Constitutional:  Positive for malaise/fatigue. Negative for weight loss.  HENT:  Negative for congestion and sore throat.   Eyes:  Negative for pain.  Respiratory:  Positive for shortness of breath. Negative for cough.   Cardiovascular:  Positive for chest pain and palpitations. Negative for orthopnea, claudication, leg swelling and PND.  Gastrointestinal:  Negative for heartburn and nausea.  Genitourinary:  Negative for frequency and urgency.  Musculoskeletal:  Negative for joint pain and myalgias.  Skin:  Negative for itching and rash.   Neurological:  Negative for dizziness and headaches.  Endo/Heme/Allergies:  Does not bruise/bleed easily.  Psychiatric/Behavioral:  The patient is nervous/anxious. The patient does not have insomnia.    All other systems reviewed and are negative.  EKGs/Labs/Other Studies Reviewed:    The following studies were reviewed today: Cardiac Monitor 06/20/21: Patch wear time was 6 days and 12 hours Predominant rhythm was NSR with average HR 98bpm (ranging from 61-145bpm) Rare SVE, VE (<1%) No patient triggered events Overall normal cardiac monitor with no significant arrhythmias or pauses     Patch Wear Time:  6 days and 12 hours (2023-03-03T18:17:35-0500 to 2023-03-10T07:10:32-0500)   Patient had a min HR of 61 bpm, max HR of 145 bpm, and avg HR of 98 bpm. Predominant underlying rhythm was Sinus Rhythm. Isolated SVEs were rare (<1.0%), and no SVE Couplets or SVE Triplets were present. Isolated VEs were rare (<1.0%), and no VE Couplets  or VE Triplets were present.  Ca Score 07/2021: EXAM: Coronary Calcium Score   TECHNIQUE: The patient was scanned on a [employer]. Axial non-contrast 3 mm slices were carried out through the heart. The data set was analyzed on a dedicated work station and scored using the [place].   FINDINGS: Non-cardiac: See separate report from [facility].   Ascending Aorta: Normal caliber.  Small aortic calcification   Pericardium: Normal.   Coronary arteries: Normal origins.   Coronary Calcium Score:   Left main: 44   Left anterior descending artery: 193   Left circumflex artery: 0   Right coronary artery: 0   Total: 21   Percentile: 99th for age, sex, and race matched control.   IMPRESSION: 1. Coronary calcium score of 237. This was 99th percentile for age, gender, and race matched controls.   RECOMMENDATIONS:   Coronary artery calcium (CAC) score is a strong predictor of incident coronary heart disease (CHD)  and provides predictive information beyond traditional risk factors. CAC scoring is reasonable to use in the decision to withhold, postpone, or initiate statin therapy in intermediate-risk or selected borderline-risk asymptomatic adults (age 101-75 years and LDL-C >=70 to <190 mg/dL) who do not have diabetes or established atherosclerotic cardiovascular disease (ASCVD).* In intermediate-risk (10-year ASCVD risk >=7.5% to <20%) adults or selected borderline-risk (10-year ASCVD risk >=5% to <7.5%) adults in whom a CAC score is measured for the purpose of making a treatment decision the following recommendations have been made:   If CAC = 0, it is reasonable to withhold statin therapy and reassess in 5 to 10 years, as long as higher risk conditions are absent (diabetes mellitus, family history of premature CHD in  first degree relatives (males <55 years; females <65 years), cigarette smoking, LDL >=190 mg/dL or other independent risk factors).   If CAC is 1 to 99, it is reasonable to initiate statin therapy for patients >=79 years of age.   If CAC is >=100 or >=75th percentile, it is reasonable to initiate statin therapy at any age.   Cardiology referral should be considered for patients with CAC scores =400 or >=75th percentile.   *2018 AHA/ACC/AACVPR/AAPA/ABC/ACPM/ADA/AGS/APhA/ASPC/NLA/PCNA Guideline on the Management of Blood Cholesterol: A Report of the American College of Cardiology/American Heart Association Task Force on Clinical Practice Guidelines. J Am Coll Cardiol. 2019;73(24):3168-3209.   Rudean Haskell, MD [facility] O[facility] 05/2019: Summary    1. Technically difficult study due to body habitus.    2. The left ventricle is normal in size with normal wall thickness.    3. The left ventricular systolic function is normal, LVEF is visually  estimated at 55-60%.    4. There is grade I diastolic dysfunction (impaired relaxation).    5. The left atrium is mildly dilated in size.     6. The right ventricle is normal in size, with normal systolic function.    Left Ventricle    The left ventricle is normal in size with normal wall thickness.    The left ventricular systolic function is normal, LVEF is visually estimated  at 55-60%.    There is grade I diastolic dysfunction (impaired relaxation).   Right Ventricle    The right ventricle is normal in size, with normal systolic function.    Left Atrium    The left atrium is mildly dilated in size.   Right Atrium    The right atrium is normal  in size.    Aortic Valve    The aortic valve is trileaflet with normal appearing leaflets with normal  excursion.    There is no significant aortic regurgitation.    There is no evidence of a significant transvalvular gradient.   Pulmonic Valve    The pulmonic valve is normal.    There is no significant pulmonic regurgitation.    There is no evidence of a significant transvalvular gradient.   Mitral Valve    The mitral valve leaflets are normal with normal leaflet mobility.    There is no significant mitral valve regurgitation.   Tricuspid Valve    The tricuspid valve leaflets are normal, with normal leaflet mobility.    There is no significant tricuspid regurgitation.    Pulmonary systolic pressure cannot be estimated due to insufficient TR jet.    EKG:   06/06/21: Sinus rhythm, rate 91 bpm  Recent Labs: No results found for requested labs within last 365 days.  Recent Lipid Panel    Component Value Date/Time   CHOL 166 07/26/2021 0859   TRIG 163 (H) 07/26/2021 0859   HDL 51 07/26/2021 0859   CHOLHDL 3.3 07/26/2021 0859   CHOLHDL 4 09/26/2011 0927   VLDL 42.2 (H) 09/26/2011 0927   LDLCALC 87 07/26/2021 0859   LDLDIRECT 146.4 09/26/2011 0927           Physical Exam:    VS:  There were no vitals taken for this visit.    Wt Readings from Last 3 Encounters:  06/06/21 193 lb 6.4 oz (87.7 kg)  05/21/21 190 lb (86.2 kg)  05/08/21 190 lb (86.2 kg)      GEN: Well nourished, well developed in no acute distress HEENT: Normal NECK: No JVD; No carotid bruits CARDIAC:  RRR, 1/6 systolic murmur, no rubs or gallops RESPIRATORY:  Clear to auscultation without rales, wheezing or rhonchi  ABDOMEN: Soft, non-tender, non-distended MUSCULOSKELETAL:  No edema; No deformity  SKIN: Warm and dry NEUROLOGIC:  Alert and oriented x 3 PSYCHIATRIC:  Normal affect   ASSESSMENT:    No diagnosis found.  PLAN:    In order of problems listed above:  #Palpitations: Improved. Cardiac monitor 06/2021 overall normal with rare SVE but no arrhythmias or paues. TTE 05/2019 at OSH with EF 55-60%, G1DD, mild LAE, normal RV, no significant valve disease.  -***  #HTN: -Continue benxapril-HCTZ 20-12.'5mg'$  daily -Continue dilt '300mg'$  daily  #Elevated Ca Score 237: #HLD: #Family History of CAD: Ca score 237 (99%) in 07/2021. Was on lipitor '40mg'$  daily but LDL above goal at 87 and zetia was added. Needs repeat lipids today. -Continue lipitor '40mg'$  daily -Continue zetia '10mg'$  daily -Repeat lipids today  #Obesity: -Continue ozempic and lifestyle modifications     Medication Adjustments/Labs and Tests Ordered: Current medicines are reviewed at length with the patient today.  Concerns regarding medicines are outlined above.  No orders of the defined types were placed in this encounter.  No orders of the defined types were placed in this encounter.   There are no Patient Instructions on file for this visit.    I,Mykaella Javier,acting as a scribe for Freada Bergeron, MD.,have documented all relevant documentation on the behalf of Freada Bergeron, MD,as directed by  Freada Bergeron, MD while in the presence of Freada Bergeron, MD.  I, Freada Bergeron, MD, have reviewed all documentation for this visit. The documentation on 01/03/22 for the exam, diagnosis, procedures, and orders are all accurate and complete.   Signed, Freada Bergeron,  MD  01/03/2022 1:46 PM    [place]

## 2022-01-07 ENCOUNTER — Encounter: Payer: Self-pay | Admitting: Cardiology

## 2022-01-07 ENCOUNTER — Ambulatory Visit: Payer: BLUE CROSS/BLUE SHIELD | Attending: Cardiology | Admitting: Cardiology

## 2022-01-07 VITALS — BP 120/90 | HR 100 | Ht 63.0 in | Wt 169.6 lb

## 2022-01-07 DIAGNOSIS — I2584 Coronary atherosclerosis due to calcified coronary lesion: Secondary | ICD-10-CM

## 2022-01-07 DIAGNOSIS — Z8249 Family history of ischemic heart disease and other diseases of the circulatory system: Secondary | ICD-10-CM

## 2022-01-07 DIAGNOSIS — R072 Precordial pain: Secondary | ICD-10-CM | POA: Diagnosis not present

## 2022-01-07 DIAGNOSIS — I2510 Atherosclerotic heart disease of native coronary artery without angina pectoris: Secondary | ICD-10-CM

## 2022-01-07 DIAGNOSIS — Z79899 Other long term (current) drug therapy: Secondary | ICD-10-CM | POA: Diagnosis not present

## 2022-01-07 DIAGNOSIS — R002 Palpitations: Secondary | ICD-10-CM

## 2022-01-07 DIAGNOSIS — R079 Chest pain, unspecified: Secondary | ICD-10-CM

## 2022-01-07 DIAGNOSIS — E785 Hyperlipidemia, unspecified: Secondary | ICD-10-CM

## 2022-01-07 DIAGNOSIS — E669 Obesity, unspecified: Secondary | ICD-10-CM

## 2022-01-07 DIAGNOSIS — Z6834 Body mass index (BMI) 34.0-34.9, adult: Secondary | ICD-10-CM

## 2022-01-07 LAB — LIPID PANEL
Chol/HDL Ratio: 2.4 ratio (ref 0.0–4.4)
Cholesterol, Total: 115 mg/dL (ref 100–199)
HDL: 47 mg/dL (ref >39)
LDL Chol Calc (NIH): 46 mg/dL (ref 0–99)
Triglycerides: 125 mg/dL (ref 0–149)
VLDL Cholesterol Cal: 22 mg/dL (ref 5–40)

## 2022-01-07 LAB — BASIC METABOLIC PANEL
BUN/Creatinine Ratio: 16 (ref 9–23)
BUN: 9 mg/dL (ref 6–24)
CO2: 23 mmol/L (ref 20–29)
Calcium: 9.2 mg/dL (ref 8.7–10.2)
Chloride: 102 mmol/L (ref 96–106)
Creatinine, Ser: 0.55 mg/dL — ABNORMAL LOW (ref 0.57–1.00)
Glucose: 79 mg/dL (ref 70–99)
Potassium: 4.5 mmol/L (ref 3.5–5.2)
Sodium: 140 mmol/L (ref 134–144)
eGFR: 112 mL/min/{1.73_m2} (ref >59)

## 2022-01-07 MED ORDER — METOPROLOL TARTRATE 100 MG PO TABS: 100.0000 mg | ORAL_TABLET | Freq: Once | ORAL | 0 refills | Status: DC

## 2022-01-07 NOTE — Progress Notes (Addendum)
Cardiology Office Note:    Date:  01/07/2022   ID:  Priscilla Harris, DOB 1970/05/11, MRN [id number]  PCP:  Neale Burly, MD   [facility] Providers Cardiologist:  None {  Referring MD: Neale Burly, MD   History of Present Illness:    Priscilla Harris is a 51 y.o. female with a hx of autoimmune hepatitis, DMII, GERD, HTN, HLD,  and IBS who presents to clinic for follow-up.  Patient was initially seen on 06/2021 where she was having episodes of palpitations. Zio monitor 06/20/21 showed NSR with rare SVE. Ca score for risk stratification showed score of 237 which was the 99% for age, gender, race matched controls. She was continued on lipitor '40mg'$  daily at that time.  Today, she says she is doing well. She has been losing weight and her shortness of breath has significantly improved. She has lost 30lbs with ozempic.   She continues to experience intermittent chest tightness. This mainly occurs when she is feeling stressed. Does not notice it with exertion but admits she is not performing strenuous activity.  She also reports feeling weakness during activities such as going up stairs and gardening, and will have to stop and rest. This is unusual for her and has been happening for about 1 year.  She is wondering if this may be related to her heart arteries given her Ca score.   She denies any palpitations, shortness of breath, or peripheral edema. No lightheadedness, headaches, syncope, orthopnea, or PND.  Past Medical History:  Diagnosis Date   Allergy    SEASONAL   Arthritis    OA   Autoimmune hepatitis ([place])    2015-2017   Barrett's esophagus    PT STATES DR Olevia Perches STATED MISDIAGNOSED WITH BARRETT'S   Diabetes mellitus without complication (HCC)    GERD (gastroesophageal reflux disease)    CONTROLLED   Hiatal hernia    Hyperlipidemia    Hypertension    Irritable bowel syndrome    PAST   LFTs abnormal    Migraines    PT STATES SINUS HA NOT MIGRAINES   Seizures  ([place])    FEBRILE SEIZURES UNDER 2- NONE SINCE LESS THAN AGE 16    Past Surgical History:  Procedure Laterality Date   COLONOSCOPY  2007   LASIK     nsvd  2011   x1 with Epidural   UPPER GASTROINTESTINAL ENDOSCOPY  2005   WISDOM TOOTH EXTRACTION      Current Medications: Current Meds  Medication Sig   acyclovir (ZOVIRAX) 400 MG tablet Take by mouth.   atorvastatin (LIPITOR) 40 MG tablet Take 40 mg by mouth at bedtime.   benazepril-hydrochlorthiazide (LOTENSIN HCT) 20-12.5 MG tablet Take 1 tablet by mouth daily.   cetirizine (ZYRTEC) 10 MG tablet Take 10 mg by mouth daily.   cholecalciferol (VITAMIN D3) 25 MCG (1000 UNIT) tablet Take 1,000 Units by mouth daily. D 3   Cyanocobalamin (VITAMIN B 12 PO) Take by mouth.   diclofenac Sodium (VOLTAREN) 1 % GEL Apply topically 4 (four) times daily.   ezetimibe (ZETIA) 10 MG tablet Take 1 tablet (10 mg total) by mouth daily.   Grape Seed Extract 100 MG CAPS Take 200 mg by mouth daily.   KRILL OIL PO Take 800 mg by mouth daily.   MATZIM LA 300 MG 24 hr tablet Take 300 mg by mouth daily.   metFORMIN (GLUCOPHAGE) 500 MG tablet Take 500 mg by mouth daily.   metoprolol tartrate (LOPRESSOR)  100 MG tablet Take 1 tablet (100 mg total) by mouth once for 1 dose. Take 90-120 minutes prior to scan.   omeprazole (PRILOSEC) 20 MG capsule Take 20 mg by mouth daily.   Semaglutide, 1 MG/DOSE, 4 MG/3ML SOPN Inject 1 mg into the skin once a week.   Triamcinolone Acetonide (NASACORT AQ NA) Place into the nose.     Allergies:   Penicillins and Sulfonamide derivatives   Social History   Socioeconomic History   Marital status: Married    Spouse name: Not on file   Number of children: 1   Years of education: Not on file   Highest education level: Not on file  Occupational History   Occupation: Product manager: [employer]  Tobacco Use   Smoking status: Never   Smokeless tobacco: Never  Vaping Use   Vaping Use: Never used  Substance and  Sexual Activity   Alcohol use: Yes    Comment: occas- rare- weddings etc   Drug use: Never   Sexual activity: Yes    Birth control/protection: None  Other Topics Concern   Not on file  Social History Narrative   Regular exercise: no   Consults   Dr Olevia Perches-- GI   Dr Ronita Hipps-- GYN   HHof  26   1 month old trying to get out of crib.    [place]  To [place]                  Social Determinants of Health   Financial Resource Strain: Not on file  Food Insecurity: Not on file  Transportation Needs: Not on file  Physical Activity: Not on file  Stress: Not on file  Social Connections: Not on file     Family History: The patient's family history includes Arthritis in her sister; Breast cancer in her paternal grandmother; Colon cancer in her maternal grandmother; Colon polyps in her mother; Diabetes in her maternal grandfather; Heart disease in her maternal grandmother; Hyperlipidemia in her father; Hypertension in her mother; Kidney disease in her maternal grandmother; Other in her mother; Ovarian cancer in her maternal grandmother; Stroke in her paternal grandmother. There is no history of Esophageal cancer, Rectal cancer, or Stomach cancer.  ROS:   Please see the history of present illness.    Review of Systems  Constitutional:  Positive for malaise/fatigue. Negative for weight loss.  HENT:  Negative for congestion and sore throat.   Eyes:  Negative for pain.  Respiratory:  Negative for cough and shortness of breath.   Cardiovascular:  Positive for chest pain and palpitations. Negative for orthopnea, claudication, leg swelling and PND.  Gastrointestinal:  Negative for heartburn and nausea.  Genitourinary:  Negative for frequency and urgency.  Musculoskeletal:  Negative for joint pain and myalgias.  Skin:  Negative for itching and rash.  Neurological:  Positive for weakness. Negative for dizziness and headaches.  Endo/Heme/Allergies:  Does not bruise/bleed easily.   Psychiatric/Behavioral:  The patient is nervous/anxious. The patient does not have insomnia.    All other systems reviewed and are negative.  EKGs/Labs/Other Studies Reviewed:    The following studies were reviewed today:  CT Cardiac Score 07/17/2021: FINDINGS: 1.8 by 0.8 by 1.1 cm density in the inferomedial right breast likely to be benign asymmetry of glandular tissues or similar benign lesion, but given the asymmetry, mammography might be considered if not recently performed. Otherwise no significant extracardiac findings.   IMPRESSION: 1. 1.8 cm in long axis density in  the inferomedial right breast likely benign asymmetry of glandular tissues or similar benign lesion, but given the asymmetry, mammography might be considered if not recently performed.  Cardiac Monitor 06/20/21: Patch wear time was 6 days and 12 hours Predominant rhythm was NSR with average HR 98bpm (ranging from 61-145bpm) Rare SVE, VE (<1%) No patient triggered events Overall normal cardiac monitor with no significant arrhythmias or pauses     Patch Wear Time:  6 days and 12 hours (2023-03-03T18:17:35-0500 to 2023-03-10T07:10:32-0500)   Patient had a min HR of 61 bpm, max HR of 145 bpm, and avg HR of 98 bpm. Predominant underlying rhythm was Sinus Rhythm. Isolated SVEs were rare (<1.0%), and no SVE Couplets or SVE Triplets were present. Isolated VEs were rare (<1.0%), and no VE Couplets  or VE Triplets were present.   Ca Score 07/2021: EXAM: Coronary Calcium Score   TECHNIQUE: The patient was scanned on a [employer]. Axial non-contrast 3 mm slices were carried out through the heart. The data set was analyzed on a dedicated work station and scored using the [place].   FINDINGS: Non-cardiac: See separate report from [facility].   Ascending Aorta: Normal caliber.  Small aortic calcification   Pericardium: Normal.   Coronary arteries: Normal origins.   Coronary  Calcium Score:   Left main: 44   Left anterior descending artery: 193   Left circumflex artery: 0   Right coronary artery: 0   Total: 16   Percentile: 99th for age, sex, and race matched control.   IMPRESSION: 1. Coronary calcium score of 237. This was 99th percentile for age, gender, and race matched controls.   RECOMMENDATIONS:   Coronary artery calcium (CAC) score is a strong predictor of incident coronary heart disease (CHD) and provides predictive information beyond traditional risk factors. CAC scoring is reasonable to use in the decision to withhold, postpone, or initiate statin therapy in intermediate-risk or selected borderline-risk asymptomatic adults (age 63-75 years and LDL-C >=70 to <190 mg/dL) who do not have diabetes or established atherosclerotic cardiovascular disease (ASCVD).* In intermediate-risk (10-year ASCVD risk >=7.5% to <20%) adults or selected borderline-risk (10-year ASCVD risk >=5% to <7.5%) adults in whom a CAC score is measured for the purpose of making a treatment decision the following recommendations have been made:   If CAC = 0, it is reasonable to withhold statin therapy and reassess in 5 to 10 years, as long as higher risk conditions are absent (diabetes mellitus, family history of premature CHD in first degree relatives (males <55 years; females <65 years), cigarette smoking, LDL >=190 mg/dL or other independent risk factors).   If CAC is 1 to 99, it is reasonable to initiate statin therapy for patients >=70 years of age.   If CAC is >=100 or >=75th percentile, it is reasonable to initiate statin therapy at any age.   Cardiology referral should be considered for patients with CAC scores =400 or >=75th percentile.   *2018 AHA/ACC/AACVPR/AAPA/ABC/ACPM/ADA/AGS/APhA/ASPC/NLA/PCNA Guideline on the Management of Blood Cholesterol: A Report of the American College of Cardiology/American Heart Association Task Force on Clinical Practice  Guidelines. J Am Coll Cardiol. 2019;73(24):3168-3209.   TTE OSH 05/2019: Summary    1. Technically difficult study due to body habitus.    2. The left ventricle is normal in size with normal wall thickness.    3. The left ventricular systolic function is normal, LVEF is visually  estimated at 55-60%.    4. There is grade I diastolic dysfunction (impaired relaxation).  5. The left atrium is mildly dilated in size.    6. The right ventricle is normal in size, with normal systolic function.    Left Ventricle    The left ventricle is normal in size with normal wall thickness.    The left ventricular systolic function is normal, LVEF is visually estimated  at 55-60%.    There is grade I diastolic dysfunction (impaired relaxation).   Right Ventricle    The right ventricle is normal in size, with normal systolic function.    Left Atrium    The left atrium is mildly dilated in size.   Right Atrium    The right atrium is normal  in size.    Aortic Valve    The aortic valve is trileaflet with normal appearing leaflets with normal  excursion.    There is no significant aortic regurgitation.    There is no evidence of a significant transvalvular gradient.   Pulmonic Valve    The pulmonic valve is normal.    There is no significant pulmonic regurgitation.    There is no evidence of a significant transvalvular gradient.   Mitral Valve    The mitral valve leaflets are normal with normal leaflet mobility.    There is no significant mitral valve regurgitation.   Tricuspid Valve    The tricuspid valve leaflets are normal, with normal leaflet mobility.    There is no significant tricuspid regurgitation.    Pulmonary systolic pressure cannot be estimated due to insufficient TR jet.    EKG:  EKG is personally reviewed. 01/07/22: EKG was not ordered. 06/06/21: Sinus rhythm, rate 91 bpm  Recent Labs: No results found for requested labs within last 365 days.  Recent Lipid Panel     Component Value Date/Time   CHOL 166 07/26/2021 0859   TRIG 163 (H) 07/26/2021 0859   HDL 51 07/26/2021 0859   CHOLHDL 3.3 07/26/2021 0859   CHOLHDL 4 09/26/2011 0927   VLDL 42.2 (H) 09/26/2011 0927   LDLCALC 87 07/26/2021 0859   LDLDIRECT 146.4 09/26/2011 0927        Physical Exam:    VS:  BP (!) 120/90 (BP Location: Left Arm, Patient Position: Sitting, Cuff Size: Large)   Pulse 100   Ht '5\' 3"'$  (1.6 m)   Wt 169 lb 9.6 oz (76.9 kg)   SpO2 97%   BMI 30.04 kg/m     Wt Readings from Last 3 Encounters:  01/07/22 169 lb 9.6 oz (76.9 kg)  06/06/21 193 lb 6.4 oz (87.7 kg)  05/21/21 190 lb (86.2 kg)     GEN: Well nourished, well developed in no acute distress HEENT: Normal NECK: No JVD; No carotid bruits CARDIAC: RRR, no murmurs, rubs or gallops RESPIRATORY:  Clear to auscultation without rales, wheezing or rhonchi  ABDOMEN: Soft, non-tender, non-distended MUSCULOSKELETAL:  No edema; No deformity  SKIN: Warm and dry NEUROLOGIC:  Alert and oriented x 3 PSYCHIATRIC:  Normal affect   ASSESSMENT:    1. Precordial pain   2. Medication management   3. Family history of early CAD   48. Chest pain of uncertain etiology   5. Class 1 obesity without serious comorbidity with body mass index (BMI) of 34.0 to 34.9 in adult, unspecified obesity type   6. Hyperlipidemia, unspecified hyperlipidemia type   7. Palpitations   8. Coronary artery calcification    PLAN:    In order of problems listed above:  No problem-specific Assessment & Plan notes found  for this encounter.  #Fatigue with Exertion: #Intermittent Chest Pain: Patient reports ongoing fatigue with exertion despite weight loss. Also continues to have intermittent chest tightness with stress. Given known CAD with elevated Ca score, strong family history, and HLD, will check coronary CTA for further evaluation. -Check coronary CTA  #Palpitations: Improved. Cardiac monitor 06/2021 overall normal with rare SVE but no  arrhythmias or paues. TTE 05/2019 at OSH with EF 55-60%, G1DD, mild LAE, normal RV, no significant valve disease.  -Continue dilt '300mg'$  daily  #HTN: Well controlled at home running 120/80s. Diastolic elevated in clinic. She will monitor and let us know if BP >130/90. -Continue benxapril-HCTZ 20-12.'5mg'$  daily -Continue dilt '300mg'$  daily  #Elevated Ca Score 237: #HLD: #Family History of CAD: Ca score 237 (99%) in 07/2021. Was on lipitor '40mg'$  daily but LDL above goal at 87 and zetia was added. Plan for lipids today -Continue lipitor '40mg'$  daily -Continue zetia '10mg'$  daily -Repeat lipids today  #Obesity: Lost 30lbs and is working on lifestyle modifications -Continue ozempic and lifestyle modifications  Follow up: 6 months.     Medication Adjustments/Labs and Tests Ordered: Current medicines are reviewed at length with the patient today.  Concerns regarding medicines are outlined above.  Orders Placed This Encounter  Procedures   CT CORONARY MORPH W/CTA COR W/SCORE W/CA W/CM &/OR WO/CM   Lipid Profile   Basic metabolic panel   Meds ordered this encounter  Medications   metoprolol tartrate (LOPRESSOR) 100 MG tablet    Sig: Take 1 tablet (100 mg total) by mouth once for 1 dose. Take 90-120 minutes prior to scan.    Dispense:  1 tablet    Refill:  0   Patient Instructions  Medication Instructions:   Your physician recommends that you continue on your current medications as directed. Please refer to the Current Medication list given to you today.  *If you need a refill on your cardiac medications before your next appointment, please call your pharmacy*   Lab Work:  TODAY--BMET AND LIPIDS  If you have labs (blood work) drawn today and your tests are completely normal, you will receive your results only by: [place] (if you have MyChart) OR A paper copy in the mail If you have any lab test that is abnormal or we need to change your treatment, we will call you to review the  results.   Testing/Procedures:    Your cardiac CT will be scheduled at one of the below locations:   [facility] [address] [place], [place] [address] [phone]   If scheduled at [facility], please arrive at the [facility] and Children's Entrance (Entrance C2) of [facility] 30 minutes prior to test start time. You can use the FREE valet parking offered at entrance C (encouraged to control the heart rate for the test)  Proceed to the [facility] Radiology Department (first floor) to check-in and test prep.  All radiology patients and guests should use entrance C2 at [facility], accessed from [facility], even though the hospital's physical address listed is [address]      Please follow these instructions carefully (unless otherwise directed):   On the Night Before the Test: Be sure to Drink plenty of water. Do not consume any caffeinated/decaffeinated beverages or chocolate 12 hours prior to your test. Do not take any antihistamines 12 hours prior to your test.  On the Day of the Test: Drink plenty of water until 1  hour prior to the test. Do not eat any food 1 hour prior to test. You may take your regular medications prior to the test.  Take metoprolol 100 MG BY MOUTH (Lopressor) two hours prior to test. HOLD LOTENSIN Hydrochlorothiazide morning of the test. FEMALES- please wear underwire-free bra if available, avoid dresses & tight clothing       After the Test: Drink plenty of water. After receiving IV contrast, you may experience a mild flushed feeling. This is normal. On occasion, you may experience a mild rash up to 24 hours after the test. This is not dangerous. If this occurs, you can take Benadryl 25 mg and increase your fluid intake. If you experience trouble breathing, this can be serious. If it is severe call 911 IMMEDIATELY. If it is mild, please call our office. If you take any of these  medications: Glipizide/Metformin, Avandament, Glucavance, please do not take 48 hours after completing test unless otherwise instructed.  We will call to schedule your test 2-4 weeks out understanding that some insurance companies will need an authorization prior to the service being performed.   For non-scheduling related questions, please contact the cardiac imaging nurse navigator should you have any questions/concerns: Marchia Bond, Cardiac Imaging Nurse Navigator Gordy Clement, Cardiac Imaging Nurse Navigator [facility] Direct Office Dial: [phone]   For scheduling needs, including cancellations and rescheduling, please call [place], [phone].    Follow-Up: At [facility], you and your health needs are our priority.  As part of our continuing mission to provide you with exceptional heart care, we have created designated Provider Care Teams.  These Care Teams include your primary Cardiologist (physician) and Advanced Practice Providers (APPs -  Physician Assistants and Nurse Practitioners) who all work together to provide you with the care you need, when you need it.  We recommend signing up for the patient portal called "MyChart".  Sign up information is provided on this After Visit Summary.  MyChart is used to connect with patients for Virtual Visits (Telemedicine).  Patients are able to view lab/test results, encounter notes, upcoming appointments, etc.  Non-urgent messages can be sent to your provider as well.   To learn more about what you can do with MyChart, go to [email or web address].    Your next appointment:   6 month(s)  The format for your next appointment:   In Person  Provider:   DR. Johney Frame OR AN APP  Important Information About Sugar         I,Breanna Adamick,acting as a scribe for Freada Bergeron, MD.,have documented all relevant documentation on the behalf of Freada Bergeron, MD,as directed by   Freada Bergeron, MD while in the presence of Freada Bergeron, MD.  I, Freada Bergeron, MD, have reviewed all documentation for this visit. The documentation on 01/07/22 for the exam, diagnosis, procedures, and orders are all accurate and complete.   Signed, Freada Bergeron, MD  01/07/2022 9:20 AM    [place]

## 2022-01-07 NOTE — Patient Instructions (Signed)
Medication Instructions:   Your physician recommends that you continue on your current medications as directed. Please refer to the Current Medication list given to you today.  *If you need a refill on your cardiac medications before your next appointment, please call your pharmacy*   Lab Work:  TODAY--BMET AND LIPIDS  If you have labs (blood work) drawn today and your tests are completely normal, you will receive your results only by: [place] (if you have MyChart) OR A paper copy in the mail If you have any lab test that is abnormal or we need to change your treatment, we will call you to review the results.   Testing/Procedures:    Your cardiac CT will be scheduled at one of the below locations:   [facility] [address] [place], [place] [address] [phone]   If scheduled at [facility], please arrive at the [facility] and Children's Entrance (Entrance C2) of [facility] 30 minutes prior to test start time. You can use the FREE valet parking offered at entrance C (encouraged to control the heart rate for the test)  Proceed to the [facility] Radiology Department (first floor) to check-in and test prep.  All radiology patients and guests should use entrance C2 at [facility], accessed from [facility], even though the hospital's physical address listed is [address].      Please follow these instructions carefully (unless otherwise directed):   On the Night Before the Test: Be sure to Drink plenty of water. Do not consume any caffeinated/decaffeinated beverages or chocolate 12 hours prior to your test. Do not take any antihistamines 12 hours prior to your test.  On the Day of the Test: Drink plenty of water until 1 hour prior to the test. Do not eat any food 1 hour prior to test. You may take your regular medications prior to the test.  Take metoprolol 100 MG BY MOUTH (Lopressor) two hours  prior to test. HOLD LOTENSIN Hydrochlorothiazide morning of the test. FEMALES- please wear underwire-free bra if available, avoid dresses & tight clothing       After the Test: Drink plenty of water. After receiving IV contrast, you may experience a mild flushed feeling. This is normal. On occasion, you may experience a mild rash up to 24 hours after the test. This is not dangerous. If this occurs, you can take Benadryl 25 mg and increase your fluid intake. If you experience trouble breathing, this can be serious. If it is severe call 911 IMMEDIATELY. If it is mild, please call our office. If you take any of these medications: Glipizide/Metformin, Avandament, Glucavance, please do not take 48 hours after completing test unless otherwise instructed.  We will call to schedule your test 2-4 weeks out understanding that some insurance companies will need an authorization prior to the service being performed.   For non-scheduling related questions, please contact the cardiac imaging nurse navigator should you have any questions/concerns: Marchia Bond, Cardiac Imaging Nurse Navigator Gordy Clement, Cardiac Imaging Nurse Navigator [facility] Direct Office Dial: [phone]   For scheduling needs, including cancellations and rescheduling, please call [place], [phone].    Follow-Up: At [facility], you and your health needs are our priority.  As part of our continuing mission to provide you with exceptional heart care, we have created designated Provider Care Teams.  These Care Teams include your primary Cardiologist (physician) and Advanced Practice Providers (APPs -  Physician Assistants and Nurse Practitioners) who all work together to provide you with the care you need, when you need it.  We recommend signing up for the patient portal called "MyChart".  Sign up information is provided on this After Visit Summary.  MyChart is used to connect with  patients for Virtual Visits (Telemedicine).  Patients are able to view lab/test results, encounter notes, upcoming appointments, etc.  Non-urgent messages can be sent to your provider as well.   To learn more about what you can do with MyChart, go to [email or web address].    Your next appointment:   6 month(s)  The format for your next appointment:   In Person  Provider:   DR. Johney Frame OR AN APP  Important Information About Sugar

## 2022-01-08 ENCOUNTER — Telehealth: Payer: Self-pay | Admitting: *Deleted

## 2022-01-08 NOTE — Telephone Encounter (Signed)
-----   Message from Lorenza Evangelist, RN sent at 01/08/2022  9:55 AM EDT ----- Regarding: RE: CARDIAC CT PER DR. Ellan Lambert 10/17 at 11:00a ----- Message ----- From: Nuala Alpha, LPN Sent: [id number]/0/9326   8:57 AM EDT To: Ciro Backer; Nuala Alpha, LPN; # Subject: CARDIAC CT PER DR. Johney Frame                   Dr. Johney Frame ordered a cardiac CT on this pt for chest pain.  Order is in.  We will get her BMET and lipids while she is in the office today.  Can you please schedule and shoot me the date thereafter?   Thanks a ton, [employer]

## 2022-01-18 ENCOUNTER — Telehealth (HOSPITAL_COMMUNITY): Payer: Self-pay | Admitting: Emergency Medicine

## 2022-01-18 ENCOUNTER — Telehealth (HOSPITAL_COMMUNITY): Payer: Self-pay | Admitting: *Deleted

## 2022-01-18 NOTE — Telephone Encounter (Signed)
Attempted to call patient regarding upcoming cardiac CT appointment. °Left message on voicemail with name and callback number °Jnai Snellgrove RN Navigator Cardiac Imaging °[facility] °[phone] Office °[phone] Cell ° °

## 2022-01-18 NOTE — Telephone Encounter (Signed)
Patient returning call regarding upcoming cardiac imaging study; pt verbalizes understanding of appt date/time, parking situation and where to check in, medications ordered, and verified current allergies; name and call back number provided for further questions should they arise  Gordy Clement RN Navigator Cardiac Imaging [facility] [phone] office [phone] cell  Patient to take '100mg'$  metoprolol tartrate two hours prior to her cardiac CT scan. She is awar to arrive at 10:30am.

## 2022-01-22 ENCOUNTER — Ambulatory Visit (HOSPITAL_COMMUNITY)
Admission: RE | Admit: 2022-01-22 | Discharge: 2022-01-22 | Disposition: A | Discharge status: Home / Self Care | Payer: BLUE CROSS/BLUE SHIELD | Source: Ambulatory Visit | Attending: Cardiology | Admitting: Cardiology

## 2022-01-22 ENCOUNTER — Other Ambulatory Visit: Payer: Self-pay | Admitting: Cardiology

## 2022-01-22 ENCOUNTER — Ambulatory Visit (HOSPITAL_BASED_OUTPATIENT_CLINIC_OR_DEPARTMENT_OTHER)
Admission: RE | Admit: 2022-01-22 | Discharge: 2022-01-22 | Disposition: A | Discharge status: Home / Self Care | Payer: BLUE CROSS/BLUE SHIELD | Source: Ambulatory Visit | Attending: Cardiology | Admitting: Cardiology

## 2022-01-22 DIAGNOSIS — R072 Precordial pain: Secondary | ICD-10-CM | POA: Diagnosis present

## 2022-01-22 DIAGNOSIS — Z8249 Family history of ischemic heart disease and other diseases of the circulatory system: Secondary | ICD-10-CM | POA: Diagnosis present

## 2022-01-22 DIAGNOSIS — R931 Abnormal findings on diagnostic imaging of heart and coronary circulation: Secondary | ICD-10-CM

## 2022-01-22 DIAGNOSIS — I2510 Atherosclerotic heart disease of native coronary artery without angina pectoris: Secondary | ICD-10-CM | POA: Diagnosis not present

## 2022-01-22 DIAGNOSIS — R079 Chest pain, unspecified: Secondary | ICD-10-CM | POA: Insufficient documentation

## 2022-01-22 MED ORDER — NITROGLYCERIN 0.4 MG SL SUBL
0.8000 mg | SUBLINGUAL_TABLET | Freq: Once | SUBLINGUAL | Status: AC
  Administered 2022-01-22: 0.8 mg via SUBLINGUAL

## 2022-01-22 MED ORDER — DILTIAZEM HCL 25 MG/5ML IV SOLN
5.0000 mg | INTRAVENOUS | Status: DC | PRN
  Administered 2022-01-22: 10 mg via INTRAVENOUS

## 2022-01-22 MED ORDER — NITROGLYCERIN 0.4 MG SL SUBL
SUBLINGUAL_TABLET | SUBLINGUAL | Status: AC
  Filled 2022-01-22: qty 2

## 2022-01-22 MED ORDER — DILTIAZEM HCL 25 MG/5ML IV SOLN
INTRAVENOUS | Status: AC
  Administered 2022-01-22: 10 mg via INTRAVENOUS
  Filled 2022-01-22: qty 5

## 2022-01-22 MED ORDER — IOHEXOL 350 MG/ML SOLN
100.0000 mL | Freq: Once | INTRAVENOUS | Status: AC | PRN
  Administered 2022-01-22: 100 mL via INTRAVENOUS

## 2022-01-22 MED ORDER — METOPROLOL TARTRATE 5 MG/5ML IV SOLN
5.0000 mg | INTRAVENOUS | Status: DC | PRN
  Administered 2022-01-22: 10 mg via INTRAVENOUS

## 2022-01-22 MED ORDER — METOPROLOL TARTRATE 5 MG/5ML IV SOLN
INTRAVENOUS | Status: AC
  Filled 2022-01-22: qty 10

## 2022-01-23 ENCOUNTER — Telehealth: Payer: Self-pay | Admitting: *Deleted

## 2022-01-23 DIAGNOSIS — I288 Other diseases of pulmonary vessels: Secondary | ICD-10-CM

## 2022-01-23 NOTE — Telephone Encounter (Signed)
The patient has been notified of the result and verbalized understanding.  All questions (if any) were answered.  Pt is aware that we will order for her to get an echo done, to further evaluate finding of mildly dilated pulmonary artery, noted on CT.   Pt aware that I will place the order for the echo in the system, and send a message to our Echo Scheduler to call her back and arrange this appt.  Pt verbalized understanding and agrees with this plan.

## 2022-01-23 NOTE — Telephone Encounter (Signed)
-----   Message from Freada Bergeron, MD sent at 01/22/2022  2:15 PM EDT ----- Her CT scan shows that she has moderate plaque in her heart arteries but flow analysis is normal. This mean there is plaque present but it is not blocking flow. We will continue the lipitor at this time.   She was incidentally found to have a mildly dilated pulmonary artery. This may indicate higher pressures in the lungs. Can we just get an echo to evaluate this further.

## 2022-02-08 ENCOUNTER — Ambulatory Visit (HOSPITAL_COMMUNITY): Payer: BLUE CROSS/BLUE SHIELD | Attending: Cardiology

## 2022-02-08 DIAGNOSIS — I288 Other diseases of pulmonary vessels: Secondary | ICD-10-CM | POA: Insufficient documentation

## 2022-02-08 LAB — ECHOCARDIOGRAM COMPLETE
Area-P 1/2: 5.27 cm2
S' Lateral: 3.1 cm

## 2022-02-25 ENCOUNTER — Other Ambulatory Visit: Payer: Self-pay

## 2022-02-25 MED ORDER — EZETIMIBE 10 MG PO TABS: 10.0000 mg | ORAL_TABLET | Freq: Every day | ORAL | 3 refills | Status: DC

## 2022-02-25 NOTE — Telephone Encounter (Signed)
Pt's medication was sent to pt's pharmacy as requested. Confirmation received.  °

## 2022-04-13 ENCOUNTER — Encounter: Payer: Self-pay | Admitting: Cardiology

## 2022-04-26 ENCOUNTER — Other Ambulatory Visit: Payer: Self-pay | Admitting: Pharmacist

## 2022-04-26 MED ORDER — SEMAGLUTIDE (1 MG/DOSE) 4 MG/3ML ~~LOC~~ SOPN: 1.0000 mg | PEN_INJECTOR | SUBCUTANEOUS | 11 refills | Status: DC

## 2022-05-11 IMAGING — CT CT CARDIAC CORONARY ARTERY CALCIUM SCORE
3 series · 14 of 20 positions shown, 16 images · non-contrast
Comparison: None.

Addendum:
CLINICAL DATA: Risk stratification: 50 Year-old White Female

EXAM:
Coronary Calcium Score
TECHNIQUE: The patient was scanned on a Siemens Force scanner. Axial
non-contrast 3 mm slices were carried out through the heart. The
data set was analyzed on a dedicated work station and scored using
the Agatson method.

[Series 2: cascseq 2.0 sa36 (id) (id) · axial · 0.39mm/px · z∈[-224,-154]mm · 4 of 59 slices shown]
[im 12/59  vessel]
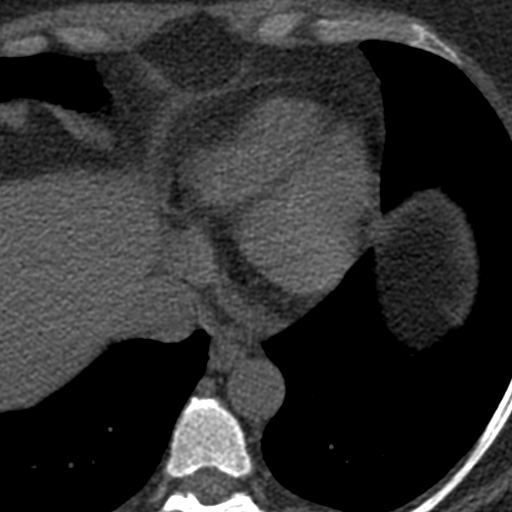
[im 24/59  vessel]
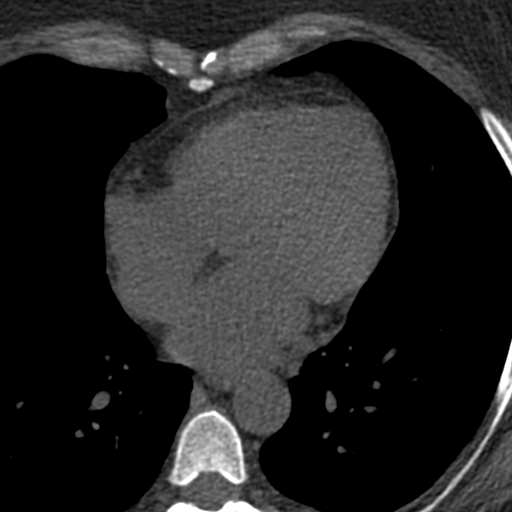
[im 35/59  vessel]
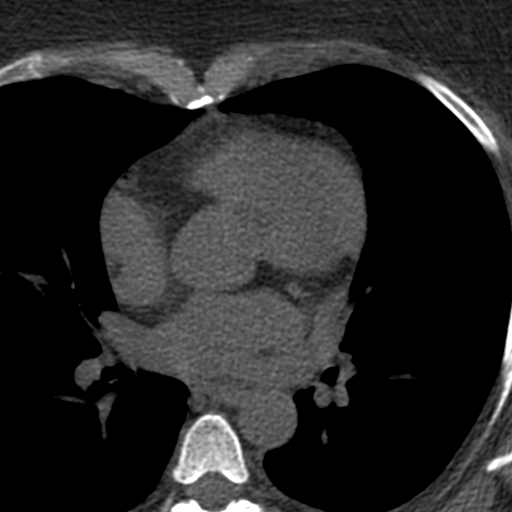
[im 47/59  vessel]
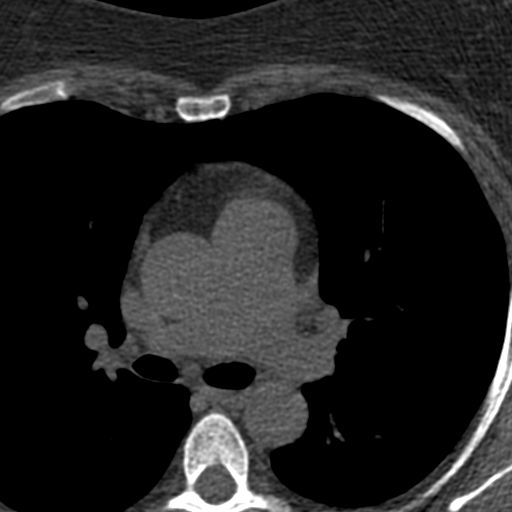

[Series 3: cascseq 2.0 bf37 st · axial · 0.71mm/px · z∈[-228,-150]mm · 5 of 59 slices shown, 7 images]
[im 10/59  vessel]
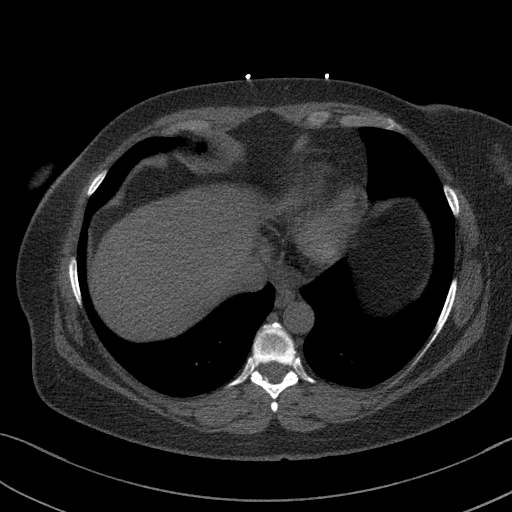
[im 10/59  lung]
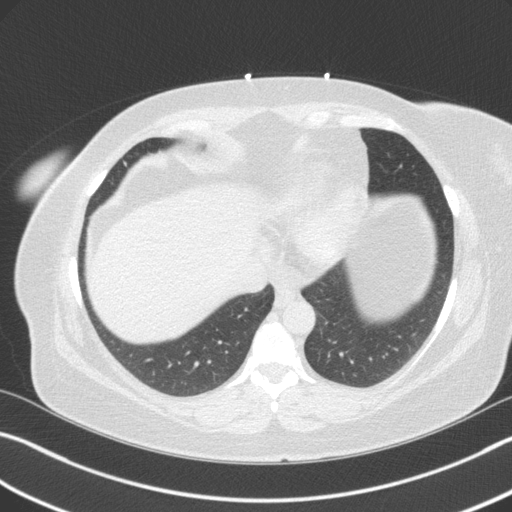
[im 20/59  vessel]
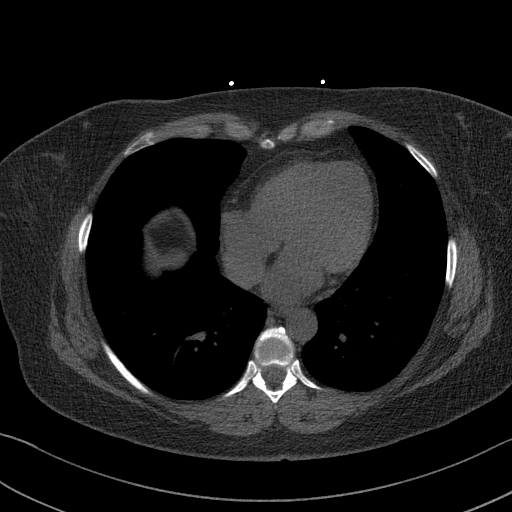
[im 30/59  vessel]
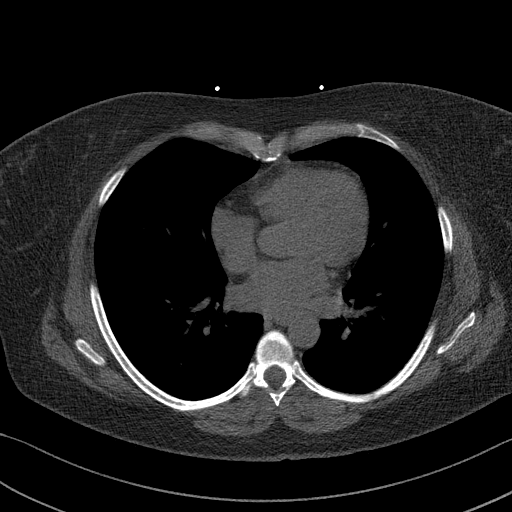
[im 39/59  vessel]
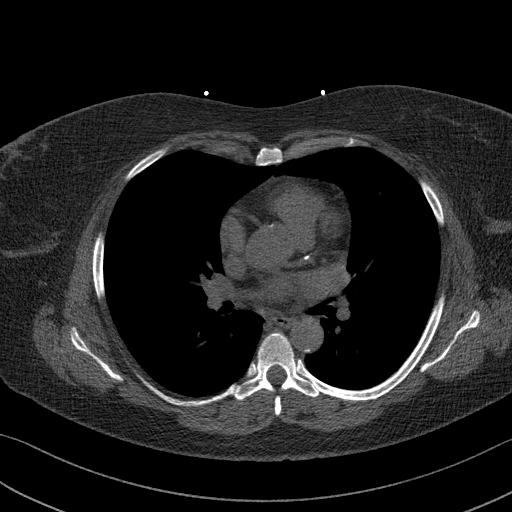
[im 49/59  vessel]
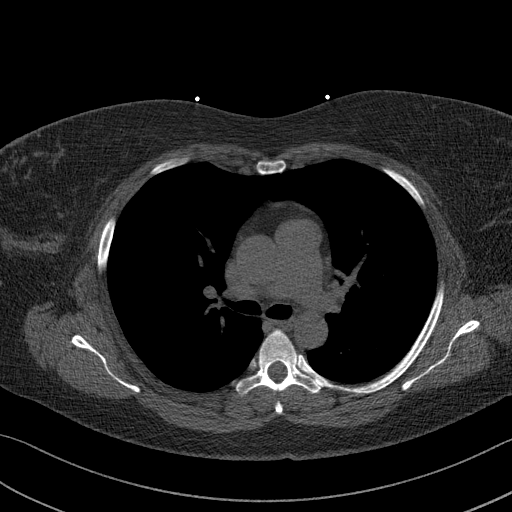
[im 49/59  lung]
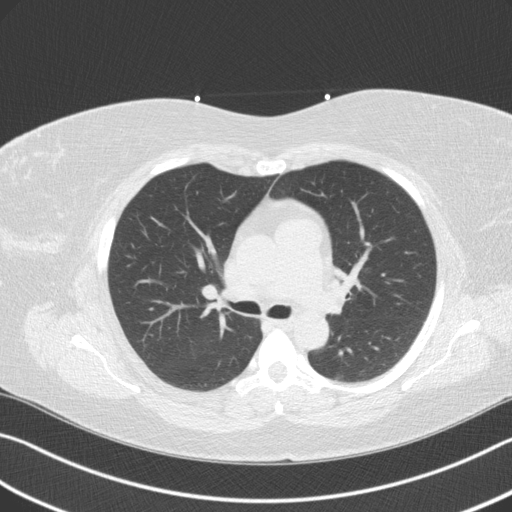

[Series 4: cascseq 2.0 br59 lung · axial · 0.71mm/px · z∈[-228,-150]mm · 5 of 59 slices shown]
[im 10/59  lung]
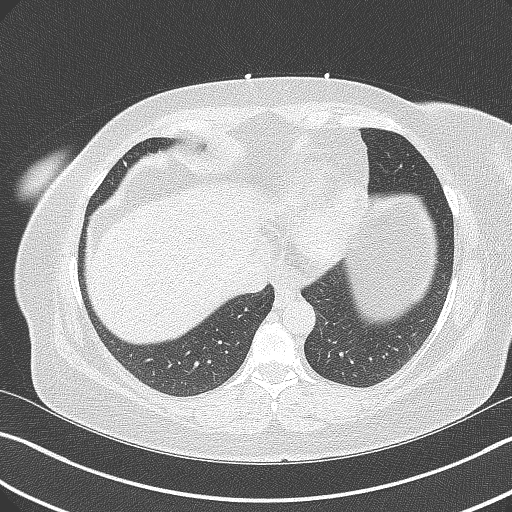
[im 20/59  lung]
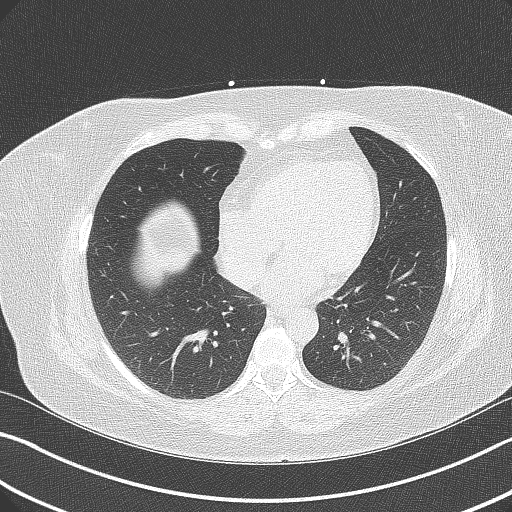
[im 30/59  lung]
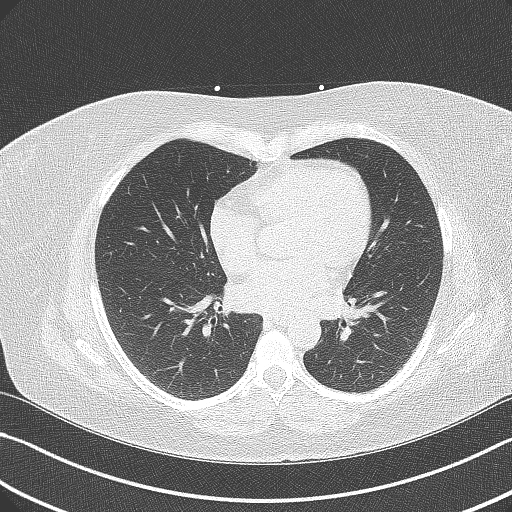
[im 39/59  lung]
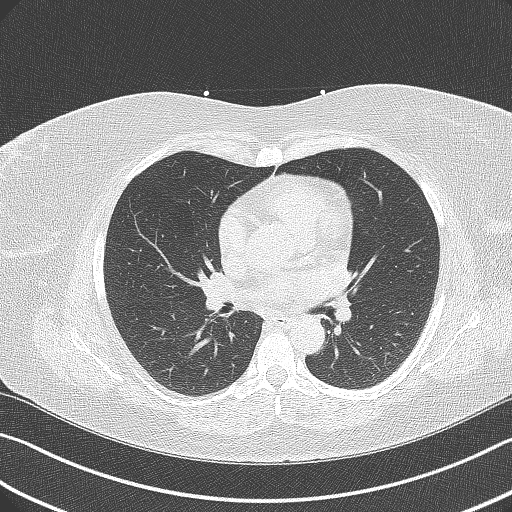
[im 49/59  lung]
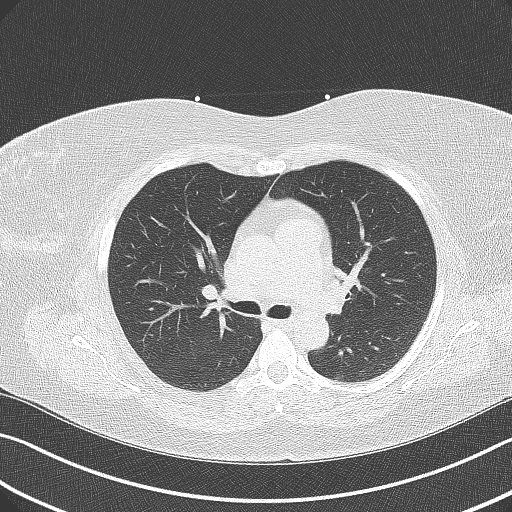

[14 of 20 positions shown; findings below may reference images not displayed]

FINDINGS: Non-cardiac: See separate report from [REDACTED].

Ascending Aorta: Normal caliber.  Small aortic calcification

Pericardium: Normal.

Coronary arteries: Normal origins.

Coronary Calcium Score:

Left main: 44

Left anterior descending artery: 193

Left circumflex artery: 0

Right coronary artery: 0

Total: 237

Percentile: 99th for age, sex, and race matched control.
IMPRESSION: 1. Coronary calcium score of 237. This was 99th percentile for age,
gender, and race matched controls.

RECOMMENDATIONS:



If CAC = 0, it is reasonable to withhold statin therapy and reassess
in 5 to 10 years, as long as higher risk conditions are absent
(diabetes mellitus, family history of premature CHD in first degree
relatives (males <55 years; females <65 years), cigarette smoking,
LDL >=190 mg/dL or other independent risk factors).

If CAC is 1 to 99, it is reasonable to initiate statin therapy for
patients >=55 years of age.

If CAC is >=100 or >=75th percentile, it is reasonable to initiate
statin therapy at any age.

Cardiology referral should be considered for patients with CAC
scores =400 or >=75th percentile.

*4560 AHA/ACC/AACVPR/AAPA/ABC/BELENIUC/RAMILA/AUJLA/Macka/KUMIKO/PANCADA/PAN
Guideline on the Management of Blood Cholesterol: A Report of the
American College of Cardiology/American Heart Association Task Force
on Clinical Practice Guidelines. J Am Coll Cardiol.
7250;73(24):3756-3474.

EXAM:
OVER-READ INTERPRETATION  CT CHEST

The following report is an over-read performed by radiologist Dr.
over-read does not include interpretation of cardiac or coronary
anatomy or pathology. The coronary calcium score interpretation by
the cardiologist is attached.
FINDINGS: 1.8 by 0.8 by 1.1 cm density in the inferomedial right breast likely
to be benign asymmetry of glandular tissues or similar benign
lesion, but given the asymmetry, mammography might be considered if
not recently performed. Otherwise no significant extracardiac
findings.
IMPRESSION: 1. 1.8 cm in long axis density in the inferomedial right breast
likely benign asymmetry of glandular tissues or similar benign
lesion, but given the asymmetry, mammography might be considered if
not recently performed.

*** End of Addendum ***
FINDINGS: Non-cardiac: See separate report from [REDACTED].

Ascending Aorta: Normal caliber.  Small aortic calcification

Pericardium: Normal.

Coronary arteries: Normal origins.

Coronary Calcium Score:

Left main: 44

Left anterior descending artery: 193

Left circumflex artery: 0

Right coronary artery: 0

Total: 237

Percentile: 99th for age, sex, and race matched control.
IMPRESSION: 1. Coronary calcium score of 237. This was 99th percentile for age,
gender, and race matched controls.

RECOMMENDATIONS:



If CAC = 0, it is reasonable to withhold statin therapy and reassess
in 5 to 10 years, as long as higher risk conditions are absent
(diabetes mellitus, family history of premature CHD in first degree
relatives (males <55 years; females <65 years), cigarette smoking,
LDL >=190 mg/dL or other independent risk factors).

If CAC is 1 to 99, it is reasonable to initiate statin therapy for
patients >=55 years of age.

If CAC is >=100 or >=75th percentile, it is reasonable to initiate
statin therapy at any age.

Cardiology referral should be considered for patients with CAC
scores =400 or >=75th percentile.

*4560 AHA/ACC/AACVPR/AAPA/ABC/BELENIUC/RAMILA/AUJLA/Macka/KUMIKO/PANCADA/PAN
Guideline on the Management of Blood Cholesterol: A Report of the
American College of Cardiology/American Heart Association Task Force
on Clinical Practice Guidelines. J Am Coll Cardiol.
7250;73(24):3756-3474.

## 2022-07-01 NOTE — Progress Notes (Signed)
Cardiology Office Note:    Date:  07/05/2022   ID:  Priscilla Harris, DOB 02/17/1971, MRN [id number]  PCP:  Neale Burly, MD   [facility] Providers Cardiologist:  None {  Referring MD: Neale Burly, MD   History of Present Illness:    Priscilla Harris is a 52 y.o. female with a hx of autoimmune hepatitis, DMII, GERD, HTN, HLD,  and IBS who presents to clinic for follow-up.  Patient was initially seen on 06/2021 where she was having episodes of palpitations. Zio monitor 06/20/21 showed NSR with rare SVE. Ca score for risk stratification showed score of 237 which was the 99% for age, gender, race matched controls. She was continued on lipitor 40mg  daily at that time.  Was last seen on 01/2022 where she was having worsening fatigue with exertion. Coronary CTA 01/2022 which showed moderate mild LAD stenosis with negative FFR. TTE 02/2022 with LVEF 60-65%, normal RV, no significant valve disease.  Today, the patient overall feels well. No chest pain, SOB, orthopnea or PND. Has occasional notifications from her apple watch for rapid HR but she is not symptomatic at that time. Blood pressure is well controlled and tolerating medications as prescribed. No dizziness, lightheadedness, or syncope. Has lost about 25-30lbs on the ozempic.   Past Medical History:  Diagnosis Date   Allergy    SEASONAL   Arthritis    OA   Autoimmune hepatitis ([place])    2015-2017   Barrett's esophagus    PT STATES DR Olevia Perches STATED MISDIAGNOSED WITH BARRETT'S   Diabetes mellitus without complication (HCC)    GERD (gastroesophageal reflux disease)    CONTROLLED   Hiatal hernia    Hyperlipidemia    Hypertension    Irritable bowel syndrome    PAST   LFTs abnormal    Migraines    PT STATES SINUS HA NOT MIGRAINES   Seizures ([place])    FEBRILE SEIZURES UNDER 2- NONE SINCE LESS THAN AGE 19    Past Surgical History:  Procedure Laterality Date   COLONOSCOPY  2007   LASIK     nsvd  2011   x1 with  Epidural   UPPER GASTROINTESTINAL ENDOSCOPY  2005   WISDOM TOOTH EXTRACTION      Current Medications: Current Meds  Medication Sig   acyclovir (ZOVIRAX) 400 MG tablet Take by mouth.   aspirin EC 81 MG tablet Take 1 tablet (81 mg total) by mouth daily. Swallow whole.   atorvastatin (LIPITOR) 40 MG tablet Take 40 mg by mouth at bedtime.   benazepril-hydrochlorthiazide (LOTENSIN HCT) 20-12.5 MG tablet Take 1 tablet by mouth daily.   cetirizine (ZYRTEC) 10 MG tablet Take 10 mg by mouth daily.   cholecalciferol (VITAMIN D3) 25 MCG (1000 UNIT) tablet Take 1,000 Units by mouth daily. D 3   Cyanocobalamin (VITAMIN B 12 PO) Take by mouth.   diclofenac Sodium (VOLTAREN) 1 % GEL Apply topically 4 (four) times daily.   ezetimibe (ZETIA) 10 MG tablet Take 1 tablet (10 mg total) by mouth daily.   Grape Seed Extract 100 MG CAPS Take 200 mg by mouth daily.   KRILL OIL PO Take 800 mg by mouth daily.   MATZIM LA 300 MG 24 hr tablet Take 300 mg by mouth daily.   metFORMIN (GLUCOPHAGE) 500 MG tablet Take 500 mg by mouth daily.   omeprazole (PRILOSEC) 20 MG capsule Take 20 mg by mouth daily.   Semaglutide, 1 MG/DOSE, 4 MG/3ML SOPN Inject 1 mg  into the skin once a week.   Triamcinolone Acetonide (NASACORT AQ NA) Place into the nose.     Allergies:   Penicillins and Sulfonamide derivatives   Social History   Socioeconomic History   Marital status: Married    Spouse name: Not on file   Number of children: 1   Years of education: Not on file   Highest education level: Not on file  Occupational History   Occupation: Product manager: [employer]  Tobacco Use   Smoking status: Never   Smokeless tobacco: Never  Vaping Use   Vaping Use: Never used  Substance and Sexual Activity   Alcohol use: Yes    Comment: occas- rare- weddings etc   Drug use: Never   Sexual activity: Yes    Birth control/protection: None  Other Topics Concern   Not on file  Social History Narrative   Regular  exercise: no   Consults   Dr Olevia Perches-- GI   Dr Ronita Hipps-- GYN   HHof  45   87 month old trying to get out of crib.    [place]  To [place]                  Social Determinants of Health   Financial Resource Strain: Not on file  Food Insecurity: Not on file  Transportation Needs: Not on file  Physical Activity: Not on file  Stress: Not on file  Social Connections: Not on file     Family History: The patient's family history includes Arthritis in her sister; Breast cancer in her paternal grandmother; Colon cancer in her maternal grandmother; Colon polyps in her mother; Diabetes in her maternal grandfather; Heart disease in her maternal grandmother; Hyperlipidemia in her father; Hypertension in her mother; Kidney disease in her maternal grandmother; Other in her mother; Ovarian cancer in her maternal grandmother; Stroke in her paternal grandmother. There is no history of Esophageal cancer, Rectal cancer, or Stomach cancer.  ROS:   Please see the history of present illness.    Review of Systems  Constitutional:  Negative for malaise/fatigue and weight loss.  HENT:  Negative for congestion and sore throat.   Eyes:  Negative for pain.  Respiratory:  Negative for cough and shortness of breath.   Cardiovascular:  Positive for palpitations. Negative for chest pain, orthopnea, claudication, leg swelling and PND.  Gastrointestinal:  Negative for heartburn and nausea.  Genitourinary:  Negative for frequency and urgency.  Musculoskeletal:  Negative for joint pain and myalgias.  Skin:  Negative for itching and rash.  Neurological:  Negative for dizziness, loss of consciousness and headaches.  Endo/Heme/Allergies:  Does not bruise/bleed easily.  Psychiatric/Behavioral:  The patient is nervous/anxious. The patient does not have insomnia.    All other systems reviewed and are negative.  EKGs/Labs/Other Studies Reviewed:    The following studies were reviewed today: TTE  02/2022: IMPRESSIONS     1. Left ventricular ejection fraction, by estimation, is 60 to 65%. The  left ventricle has normal function. The left ventricle has no regional  wall motion abnormalities. Indeterminate diastolic filling due to E-A  fusion.   2. Right ventricular systolic function is normal. The right ventricular  size is normal. Tricuspid regurgitation signal is inadequate for assessing  PA pressure.   3. The mitral valve is normal in structure. No evidence of mitral valve  regurgitation.   4. The aortic valve is tricuspid. Aortic valve regurgitation is not  visualized.   5. The  inferior vena cava is normal in size with greater than 50%  respiratory variability, suggesting right atrial pressure of 3 mmHg.   Comparison(s): No prior Echocardiogram.   CT Cardiac Score 07/17/2021: FINDINGS: 1.8 by 0.8 by 1.1 cm density in the inferomedial right breast likely to be benign asymmetry of glandular tissues or similar benign lesion, but given the asymmetry, mammography might be considered if not recently performed. Otherwise no significant extracardiac findings.   IMPRESSION: 1. 1.8 cm in long axis density in the inferomedial right breast likely benign asymmetry of glandular tissues or similar benign lesion, but given the asymmetry, mammography might be considered if not recently performed.  Cardiac Monitor 06/20/21: Patch wear time was 6 days and 12 hours Predominant rhythm was NSR with average HR 98bpm (ranging from 61-145bpm) Rare SVE, VE (<1%) No patient triggered events Overall normal cardiac monitor with no significant arrhythmias or pauses     Patch Wear Time:  6 days and 12 hours (2023-03-03T18:17:35-0500 to 2023-03-10T07:10:32-0500)   Patient had a min HR of 61 bpm, max HR of 145 bpm, and avg HR of 98 bpm. Predominant underlying rhythm was Sinus Rhythm. Isolated SVEs were rare (<1.0%), and no SVE Couplets or SVE Triplets were present. Isolated VEs were rare (<1.0%),  and no VE Couplets  or VE Triplets were present.   Ca Score 07/2021: EXAM: Coronary Calcium Score   TECHNIQUE: The patient was scanned on a [employer]. Axial non-contrast 3 mm slices were carried out through the heart. The data set was analyzed on a dedicated work station and scored using the [place].   FINDINGS: Non-cardiac: See separate report from [facility].   Ascending Aorta: Normal caliber.  Small aortic calcification   Pericardium: Normal.   Coronary arteries: Normal origins.   Coronary Calcium Score:   Left main: 44   Left anterior descending artery: 193   Left circumflex artery: 0   Right coronary artery: 0   Total: 28   Percentile: 99th for age, sex, and race matched control.   IMPRESSION: 1. Coronary calcium score of 237. This was 99th percentile for age, gender, and race matched controls.   RECOMMENDATIONS:   Coronary artery calcium (CAC) score is a strong predictor of incident coronary heart disease (CHD) and provides predictive information beyond traditional risk factors. CAC scoring is reasonable to use in the decision to withhold, postpone, or initiate statin therapy in intermediate-risk or selected borderline-risk asymptomatic adults (age 25-75 years and LDL-C >=70 to <190 mg/dL) who do not have diabetes or established atherosclerotic cardiovascular disease (ASCVD).* In intermediate-risk (10-year ASCVD risk >=7.5% to <20%) adults or selected borderline-risk (10-year ASCVD risk >=5% to <7.5%) adults in whom a CAC score is measured for the purpose of making a treatment decision the following recommendations have been made:   If CAC = 0, it is reasonable to withhold statin therapy and reassess in 5 to 10 years, as long as higher risk conditions are absent (diabetes mellitus, family history of premature CHD in first degree relatives (males <55 years; females <65 years), cigarette smoking, LDL >=190 mg/dL or other  independent risk factors).   If CAC is 1 to 99, it is reasonable to initiate statin therapy for patients >=46 years of age.   If CAC is >=100 or >=75th percentile, it is reasonable to initiate statin therapy at any age.   Cardiology referral should be considered for patients with CAC scores =400 or >=75th percentile.   *2018 AHA/ACC/AACVPR/AAPA/ABC/ACPM/ADA/AGS/APhA/ASPC/NLA/PCNA Guideline on the Management  of Blood Cholesterol: A Report of the [employer] of [employer]iology/American Heart Association Task Force on Clinical Practice Guidelines. J Am Coll Cardiol. 2019;73(24):3168-3209.   TTE OSH 05/2019: Summary    1. Technically difficult study due to body habitus.    2. The left ventricle is normal in size with normal wall thickness.    3. The left ventricular systolic function is normal, LVEF is visually  estimated at 55-60%.    4. There is grade I diastolic dysfunction (impaired relaxation).    5. The left atrium is mildly dilated in size.    6. The right ventricle is normal in size, with normal systolic function.    Left Ventricle    The left ventricle is normal in size with normal wall thickness.    The left ventricular systolic function is normal, LVEF is visually estimated  at 55-60%.    There is grade I diastolic dysfunction (impaired relaxation).   Right Ventricle    The right ventricle is normal in size, with normal systolic function.    Left Atrium    The left atrium is mildly dilated in size.   Right Atrium    The right atrium is normal  in size.    Aortic Valve    The aortic valve is trileaflet with normal appearing leaflets with normal  excursion.    There is no significant aortic regurgitation.    There is no evidence of a significant transvalvular gradient.   Pulmonic Valve    The pulmonic valve is normal.    There is no significant pulmonic regurgitation.    There is no evidence of a significant transvalvular gradient.   Mitral Valve    The  mitral valve leaflets are normal with normal leaflet mobility.    There is no significant mitral valve regurgitation.   Tricuspid Valve    The tricuspid valve leaflets are normal, with normal leaflet mobility.    There is no significant tricuspid regurgitation.    Pulmonary systolic pressure cannot be estimated due to insufficient TR jet.    EKG:  EKG is personally reviewed. NSR with HR 89  Recent Labs: 01/07/2022: BUN 9; Creatinine, Ser 0.55; Potassium 4.5; Sodium 140  Recent Lipid Panel    Component Value Date/Time   CHOL 115 01/07/2022 0908   TRIG 125 01/07/2022 0908   HDL 47 01/07/2022 0908   CHOLHDL 2.4 01/07/2022 0908   CHOLHDL 4 09/26/2011 0927   VLDL 42.2 (H) 09/26/2011 0927   LDLCALC 46 01/07/2022 0908   LDLDIRECT 146.4 09/26/2011 0927        Physical Exam:    VS:  BP 116/84   Pulse 89   Ht 5\' 3"  (1.6 m)   Wt 171 lb (77.6 kg)   SpO2 98%   BMI 30.29 kg/m     Wt Readings from Last 3 Encounters:  07/05/22 171 lb (77.6 kg)  01/07/22 169 lb 9.6 oz (76.9 kg)  06/06/21 193 lb 6.4 oz (87.7 kg)     GEN: Well nourished, well developed in no acute distress HEENT: Normal NECK: No JVD; No carotid bruits CARDIAC: RRR, no murmurs, rubs or gallops RESPIRATORY:  Clear to auscultation without rales, wheezing or rhonchi  ABDOMEN: Soft, non-tender, non-distended MUSCULOSKELETAL:  No edema; No deformity  SKIN: Warm and dry NEUROLOGIC:  Alert and oriented x 3 PSYCHIATRIC:  Normal affect   ASSESSMENT:    1. Coronary artery calcification   2. Family history of early CAD   3. Hyperlipidemia, unspecified  hyperlipidemia type   4. Palpitations    PLAN:    In order of problems listed above:  No problem-specific Assessment & Plan notes found for this encounter.  #CAD: CTA with moderate mid-LAD disease with negative FFR. Currently, doing well without anginal symptoms. Will continue with secondary prevention. -Continue lipitor 40mg  daily -Continue zetia 10mg  daily -Start  ASA 81mg  daily  #Palpitations: Improved. Cardiac monitor 06/2021 overall normal with rare SVE but no arrhythmias or paues. TTE 05/2019 at OSH with EF 55-60%, G1DD, mild LAE, normal RV, no significant valve disease.  -Continue dilt 300mg   #HTN: Well controlled at home and running <130/90. -Continue benzapril-HCTZ 20-12.5mg  daily -Continue dilt 300mg   #Obesity: Lost 30lbs and is working on lifestyle modifications -Continue ozempic and lifestyle modifications  Requesting a new OBGYN as her current provider is no longer accepts her insurance. Provided referral to Dr. Royston Sinner today.  Follow up: 6 months.     Medication Adjustments/Labs and Tests Ordered: Current medicines are reviewed at length with the patient today.  Concerns regarding medicines are outlined above.  Orders Placed This Encounter  Procedures   Ambulatory referral to Obstetrics / Gynecology   EKG 12-Lead   Meds ordered this encounter  Medications   aspirin EC 81 MG tablet    Sig: Take 1 tablet (81 mg total) by mouth daily. Swallow whole.    Dispense:  90 tablet    Refill:  3   Patient Instructions  Medication Instructions:  Your physician has recommended you make the following change in your medication:   Start taking aspirin 81 mg daily  *If you need a refill on your cardiac medications before your next appointment, please call your pharmacy*   Follow-Up: At [facility], you and your health needs are our priority.  As part of our continuing mission to provide you with exceptional heart care, we have created designated Provider Care Teams.  These Care Teams include your primary Cardiologist (physician) and Advanced Practice Providers (APPs -  Physician Assistants and Nurse Practitioners) who all work together to provide you with the care you need, when you need it.   Your next appointment:   1 year(s)  Provider:   Dr. Johney Frame      Signed, Freada Bergeron, MD  07/05/2022 10:38 AM     [place]

## 2022-07-05 ENCOUNTER — Encounter: Payer: Self-pay | Admitting: Cardiology

## 2022-07-05 ENCOUNTER — Ambulatory Visit: Payer: BLUE CROSS/BLUE SHIELD | Attending: Cardiology | Admitting: Cardiology

## 2022-07-05 VITALS — BP 116/84 | HR 89 | Ht 63.0 in | Wt 171.0 lb

## 2022-07-05 DIAGNOSIS — Z8249 Family history of ischemic heart disease and other diseases of the circulatory system: Secondary | ICD-10-CM

## 2022-07-05 DIAGNOSIS — I2510 Atherosclerotic heart disease of native coronary artery without angina pectoris: Secondary | ICD-10-CM

## 2022-07-05 DIAGNOSIS — R002 Palpitations: Secondary | ICD-10-CM

## 2022-07-05 DIAGNOSIS — E785 Hyperlipidemia, unspecified: Secondary | ICD-10-CM | POA: Diagnosis not present

## 2022-07-05 DIAGNOSIS — I2584 Coronary atherosclerosis due to calcified coronary lesion: Secondary | ICD-10-CM

## 2022-07-05 MED ORDER — ASPIRIN 81 MG PO TBEC: 81.0000 mg | DELAYED_RELEASE_TABLET | Freq: Every day | ORAL | 3 refills | Status: DC

## 2022-07-05 NOTE — Patient Instructions (Signed)
Medication Instructions:  Your physician has recommended you make the following change in your medication:   Start taking aspirin 81 mg daily  *If you need a refill on your cardiac medications before your next appointment, please call your pharmacy*   Follow-Up: At [facility], you and your health needs are our priority.  As part of our continuing mission to provide you with exceptional heart care, we have created designated Provider Care Teams.  These Care Teams include your primary Cardiologist (physician) and Advanced Practice Providers (APPs -  Physician Assistants and Nurse Practitioners) who all work together to provide you with the care you need, when you need it.   Your next appointment:   1 year(s)  Provider:   Dr. Johney Frame

## 2022-08-08 ENCOUNTER — Telehealth: Payer: Self-pay | Admitting: Cardiology

## 2022-08-08 NOTE — Telephone Encounter (Signed)
Pt c/o medication issue:  1. Name of Medication: Aspirin   2. How are you currently taking this medication (dosage and times per day)? Last taken on Saturday.  3. Are you having a reaction (difficulty breathing--STAT)? Yes  4. What is your medication issue? Nausea, diarrhea, constipation, and rash started after starting aspirin. Patient's mother advised her she was unable to take aspirin as a baby so patient believes it is the cause. Please advise.

## 2022-08-08 NOTE — Telephone Encounter (Signed)
Will send this information to Dr. Shari Prows as an FYI, and I will follow-up with the pt accordingly thereafter.

## 2022-08-08 NOTE — Telephone Encounter (Signed)
Spoke with Dr. Shari Prows about this and she advised that was completely fine that she should stop this and we will update aspirin as an allergy in her chart.   Pt aware and verbalized understanding.

## 2022-10-08 ENCOUNTER — Telehealth: Payer: Self-pay | Admitting: Cardiology

## 2022-10-08 NOTE — Telephone Encounter (Signed)
Patient calling in to say that she would like to be seen, by Dr. Tenny Craw or Dr. Mayford Knife once Dr. Joneen Boers. Please advise

## 2022-10-08 NOTE — Telephone Encounter (Signed)
Returned Pts call and changed her recall for her 1 year follow up to reflect that she will be seeing Dr. Mayford Knife going forward.

## 2022-12-30 ENCOUNTER — Other Ambulatory Visit: Payer: Self-pay

## 2022-12-30 MED ORDER — EZETIMIBE 10 MG PO TABS: 10.0000 mg | ORAL_TABLET | Freq: Every day | ORAL | 5 refills | Status: AC

## 2023-04-24 LAB — LAB REPORT - SCANNED
A1c: 5.8
EGFR: 108

## 2023-06-16 ENCOUNTER — Other Ambulatory Visit: Payer: Self-pay | Admitting: Pharmacist

## 2023-06-16 MED ORDER — SEMAGLUTIDE (1 MG/DOSE) 4 MG/3ML ~~LOC~~ SOPN: 1.0000 mg | PEN_INJECTOR | SUBCUTANEOUS | 2 refills | Status: DC

## 2023-07-07 ENCOUNTER — Ambulatory Visit: Payer: BLUE CROSS/BLUE SHIELD | Attending: Cardiology | Admitting: Cardiology

## 2023-07-07 ENCOUNTER — Encounter: Payer: Self-pay | Admitting: Cardiology

## 2023-07-07 VITALS — BP 140/82 | HR 88 | Resp 16 | Ht 63.0 in | Wt 174.0 lb

## 2023-07-07 DIAGNOSIS — E785 Hyperlipidemia, unspecified: Secondary | ICD-10-CM

## 2023-07-07 DIAGNOSIS — R002 Palpitations: Secondary | ICD-10-CM | POA: Diagnosis not present

## 2023-07-07 DIAGNOSIS — I2510 Atherosclerotic heart disease of native coronary artery without angina pectoris: Secondary | ICD-10-CM

## 2023-07-07 DIAGNOSIS — I10 Essential (primary) hypertension: Secondary | ICD-10-CM

## 2023-07-07 DIAGNOSIS — R079 Chest pain, unspecified: Secondary | ICD-10-CM

## 2023-07-07 NOTE — Patient Instructions (Addendum)
 Medication Instructions:  Your physician recommends that you continue on your current medications as directed. Please refer to the Current Medication list given to you today.  *If you need a refill on your cardiac medications before your next appointment, please call your pharmacy*  Lab Work: We will call PCP for lipids If you have labs (blood work) drawn today and your tests are completely normal, you will receive your results only by: MyChart Message (if you have MyChart) OR A paper copy in the mail If you have any lab test that is abnormal or we need to change your treatment, we will call you to review the results.  Testing/Procedures: PET/CT Stress Test  Follow-Up: At [facility], you and your health needs are our priority.  As part of our continuing mission to provide you with exceptional heart care, our providers are all part of one team.  This team includes your primary Cardiologist (physician) and Advanced Practice Providers or APPs (Physician Assistants and Nurse Practitioners) who all work together to provide you with the care you need, when you need it.  Your next appointment:   1 year(s)  Provider:   Armanda Magic, MD     We recommend signing up for the patient portal called "MyChart".  Sign up information is provided on this After Visit Summary.  MyChart is used to connect with patients for Virtual Visits (Telemedicine).  Patients are able to view lab/test results, encounter notes, upcoming appointments, etc.  Non-urgent messages can be sent to your provider as well.   To learn more about what you can do with MyChart, go to [email or web address].   Other Instructions    Please report to Radiology at the [facility] Main Entrance 30 minutes early for your test.  [address] [place], [place] [address]   How to Prepare for Your Cardiac PET/CT Stress Test:  Nothing to eat or drink, except water, 3 hours prior to arrival time.  NO  caffeine/decaffeinated products, or chocolate 12 hours prior to arrival. (Please note decaffeinated beverages (teas/coffees) still contain caffeine).  If you have caffeine within 12 hours prior, the test will need to be rescheduled.  Medication instructions: Do not take erectile dysfunction medications for 72 hours prior to test (sildenafil, tadalafil) Do not take nitrates (isosorbide mononitrate, Ranexa) the day before or day of test Do not take tamsulosin the day before or morning of test Hold theophylline containing medications for 12 hours. Hold Dipyridamole 48 hours prior to the test.  Diabetic Preparation: If able to eat breakfast prior to 3 hour fasting, you may take all medications, including your insulin. Do not worry if you miss your breakfast dose of insulin - start at your next meal. If you do not eat prior to 3 hour fast-Hold all diabetes (oral and insulin) medications. Patients who wear a continuous glucose monitor MUST remove the device prior to scanning.  You may take your remaining medications with water.  NO perfume, cologne or lotion on chest or abdomen area. FEMALES - Please avoid wearing dresses to this appointment.  Total time is 1 to 2 hours; you may want to bring reading material for the waiting time.  IF YOU THINK YOU MAY BE PREGNANT, OR ARE NURSING PLEASE INFORM THE TECHNOLOGIST.  In preparation for your appointment, medication and supplies will be purchased.  Appointment availability is limited, so if you need to cancel or reschedule, please call the Radiology Department Scheduler at [phone] 24 hours in advance to avoid a  cancellation fee of $100.00  What to Expect When you Arrive:  Once you arrive and check in for your appointment, you will be taken to a preparation room within the Radiology Department.  A technologist or Nurse will obtain your medical history, verify that you are correctly prepped for the exam, and explain the procedure.  Afterwards, an  IV will be started in your arm and electrodes will be placed on your skin for EKG monitoring during the stress portion of the exam. Then you will be escorted to the PET/CT scanner.  There, staff will get you positioned on the scanner and obtain a blood pressure and EKG.  During the exam, you will continue to be connected to the EKG and blood pressure machines.  A small, safe amount of a radioactive tracer will be injected in your IV to obtain a series of pictures of your heart along with an injection of a stress agent.    After your Exam:  It is recommended that you eat a meal and drink a caffeinated beverage to counter act any effects of the stress agent.  Drink plenty of fluids for the remainder of the day and urinate frequently for the first couple of hours after the exam.  Your doctor will inform you of your test results within 7-10 business days.  For more information and frequently asked questions, please visit our website: [email or web address]  For questions about your test or how to prepare for your test, please call: Cardiac Imaging Nurse Navigators Office: [phone]       1st Floor: - Lobby - Registration  - Pharmacy  - Lab - Cafe  2nd Floor: - PV Lab - Diagnostic Testing (echo, CT, nuclear med)  3rd Floor: - Vacant  4th Floor: - TCTS (cardiothoracic surgery) - AFib Clinic - Structural Heart Clinic - Vascular Surgery  - Vascular Ultrasound  5th Floor: - HeartCare Cardiology (general and EP) - Clinical Pharmacy for coumadin, hypertension, lipid, weight-loss medications, and med management appointments    Valet parking services will be available as well.

## 2023-07-07 NOTE — Progress Notes (Signed)
 Cardiology Office Note:    Date:  07/07/2023   ID:  Priscilla Harris, DOB 11/12/1970, MRN [id number]  PCP:  Toma Deiters, MD   [facility] Providers Cardiologist:  Armanda Magic, MD {  Referring MD: Toma Deiters, MD   History of Present Illness:    Priscilla Harris is a 53 y.o. female with a hx of autoimmune hepatitis, DMII, GERD, HTN, HLD,  and IBS who presents to clinic for follow-up.  Patient was initially seen on 06/2021 where she was having episodes of palpitations. Zio monitor 06/20/21 showed NSR with rare SVE. Ca score for risk stratification showed score of 237 which was the 99% for age, gender, race matched controls. She was continued on lipitor 40mg  daily at that time.  Seen again 01/2022 when she was having worsening fatigue with exertion. Coronary CTA 01/2022 which showed moderate LAD stenosis with negative FFR. TTE 02/2022 with LVEF 60-65%, normal RV, no significant valve disease.  She is here today for followup and is doing well.  Recently she has been having chest tightness that usually occurs when she is at work and sits at a computer and is stressed emotionally.  Sometimes she will feel nauseated with the pain but no diaphoresis.  The pain is in the left upper chest with no radiation.  She also has chronic issues with her left shoulder so it is hard for her to tell what is going on. She denies any  SOB, DOE, PND, orthopnea, dizziness, palpitations or syncope. 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 She is compliant with her meds and is tolerating meds with no SE.  She has been walking for exercise.  She is currently on Ozempic and has lost 30lbs.   Past Medical History:  Diagnosis Date   Allergy    SEASONAL   Arthritis    OA   Autoimmune hepatitis (HCC)    2015-2017   Barrett's esophagus    PT STATES DR Juanda Chance STATED MISDIAGNOSED WITH BARRETT'S   Diabetes mellitus without complication (HCC)    GERD (gastroesophageal reflux  disease)    CONTROLLED   Hiatal hernia    Hyperlipidemia    Hypertension    Irritable bowel syndrome    PAST   LFTs abnormal    Migraines    PT STATES SINUS HA NOT MIGRAINES   Seizures (HCC)    FEBRILE SEIZURES UNDER 2- NONE SINCE LESS THAN AGE 4    Past Surgical History:  Procedure Laterality Date   COLONOSCOPY  2007   LASIK     nsvd  2011   x1 with Epidural   UPPER GASTROINTESTINAL ENDOSCOPY  2005   WISDOM TOOTH EXTRACTION      Current Medications: Current Meds  Medication Sig   acyclovir (ZOVIRAX) 400 MG tablet Take by mouth.   atorvastatin (LIPITOR) 40 MG tablet Take 40 mg by mouth at bedtime.   benazepril-hydrochlorthiazide (LOTENSIN HCT) 20-12.5 MG tablet Take 1 tablet by mouth daily.   Cyanocobalamin (VITAMIN B 12 PO) Take by mouth.   diclofenac Sodium (VOLTAREN) 1 % GEL Apply topically 4 (four) times daily.   ezetimibe (ZETIA) 10 MG tablet Take 1 tablet (10 mg total) by mouth daily.   Levocetirizine Dihydrochloride (XYZAL PO) Take 1 tablet by mouth as needed (for allergies).   MATZIM LA 300 MG 24 hr tablet Take 300 mg by mouth daily.   metFORMIN (GLUCOPHAGE) 500 MG tablet Take 500 mg by mouth  daily.   omeprazole (PRILOSEC) 20 MG capsule Take 20 mg by mouth daily.   Semaglutide, 1 MG/DOSE, 4 MG/3ML SOPN Inject 1 mg into the skin once a week.   Triamcinolone Acetonide (NASACORT AQ NA) Place into the nose.   [DISCONTINUED] metoprolol tartrate (LOPRESSOR) 100 MG tablet Take 1 tablet (100 mg total) by mouth once for 1 dose. Take 90-120 minutes prior to scan.     Allergies:   Aspirin, Penicillins, and Sulfonamide derivatives   Social History   Socioeconomic History   Marital status: Married    Spouse name: Not on file   Number of children: 1   Years of education: Not on file   Highest education level: Not on file  Occupational History   Occupation: Magazine features editor: [employer]  Tobacco Use   Smoking status: Never   Smokeless tobacco: Never   Vaping Use   Vaping status: Never Used  Substance and Sexual Activity   Alcohol use: Yes    Comment: occas- rare- weddings etc   Drug use: Never   Sexual activity: Yes    Birth control/protection: None  Other Topics Concern   Not on file  Social History Narrative   Regular exercise: no   Consults   Dr Juanda Chance-- GI   Dr Billy Coast-- GYN   HHof  68   10 month old trying to get out of crib.    [place]  To [place]                  Social Drivers of Health   Financial Resource Strain: Not on file  Food Insecurity: Not on file  Transportation Needs: Not on file  Physical Activity: Not on file  Stress: Not on file  Social Connections: Not on file     Family History: The patient's family history includes Arthritis in her sister; Breast cancer in her paternal grandmother; Colon cancer in her maternal grandmother; Colon polyps in her mother; Diabetes in her maternal grandfather; Heart disease in her maternal grandmother; Hyperlipidemia in her father; Hypertension in her mother; Kidney disease in her maternal grandmother; Other in her mother; Ovarian cancer in her maternal grandmother; Stroke in her paternal grandmother. There is no history of Esophageal cancer, Rectal cancer, or Stomach cancer.  ROS:   Please see the history of present illness.    Review of Systems  Constitutional:  Negative for malaise/fatigue and weight loss.  HENT:  Negative for congestion and sore throat.   Eyes:  Negative for pain.  Respiratory:  Negative for cough and shortness of breath.   Cardiovascular:  Positive for palpitations. Negative for chest pain, orthopnea, claudication, leg swelling and PND.  Gastrointestinal:  Negative for heartburn and nausea.  Genitourinary:  Negative for frequency and urgency.  Musculoskeletal:  Negative for joint pain and myalgias.  Skin:  Negative for itching and rash.  Neurological:  Negative for dizziness, loss of consciousness and headaches.  Endo/Heme/Allergies:   Does not bruise/bleed easily.  Psychiatric/Behavioral:  The patient is nervous/anxious. The patient does not have insomnia.    All other systems reviewed and are negative.  EKGs/Labs/Other Studies Reviewed:    EKG Interpretation Date/Time:  Monday July 07 2023 15:08:33 EDT Ventricular Rate:  93 PR Interval:  158 QRS Duration:  86 QT Interval:  368 QTC Calculation: 457 R Axis:   -2  Text Interpretation: Normal sinus rhythm Cannot rule out Anterior infarct , age undetermined Nonspecific T wave abnormality When compared with ECG of 01-Nov-2002  11:16, QT has lengthened Confirmed by Armanda Magic [phone] on 07/07/2023 3:40:53 PM     The following studies were reviewed today: TTE 02-25-22: IMPRESSIONS     1. Left ventricular ejection fraction, by estimation, is 60 to 65%. The  left ventricle has normal function. The left ventricle has no regional  wall motion abnormalities. Indeterminate diastolic filling due to E-A  fusion.   2. Right ventricular systolic function is normal. The right ventricular  size is normal. Tricuspid regurgitation signal is inadequate for assessing  PA pressure.   3. The mitral valve is normal in structure. No evidence of mitral valve  regurgitation.   4. The aortic valve is tricuspid. Aortic valve regurgitation is not  visualized.   5. The inferior vena cava is normal in size with greater than 50%  respiratory variability, suggesting right atrial pressure of 3 mmHg.   Comparison(s): No prior Echocardiogram.   CT Cardiac Score 07/17/2021: FINDINGS: 1.8 by 0.8 by 1.1 cm density in the inferomedial right breast likely to be benign asymmetry of glandular tissues or similar benign lesion, but given the asymmetry, mammography might be considered if not recently performed. Otherwise no significant extracardiac findings.   IMPRESSION: 1. 1.8 cm in long axis density in the inferomedial right breast likely benign asymmetry of glandular tissues or similar  benign lesion, but given the asymmetry, mammography might be considered if not recently performed.  Cardiac Monitor 06/20/21: Patch wear time was 6 days and 12 hours Predominant rhythm was NSR with average HR 98bpm (ranging from 61-145bpm) Rare SVE, VE (<1%) No patient triggered events Overall normal cardiac monitor with no significant arrhythmias or pauses     Patch Wear Time:  6 days and 12 hours (2023-03-03T18:17:35-0500 to 2023-03-10T07:10:32-0500)   Patient had a min HR of 61 bpm, max HR of 145 bpm, and avg HR of 98 bpm. Predominant underlying rhythm was Sinus Rhythm. Isolated SVEs were rare (<1.0%), and no SVE Couplets or SVE Triplets were present. Isolated VEs were rare (<1.0%), and no VE Couplets  or VE Triplets were present.   Ca Score 07/2021: EXAM: Coronary Calcium Score   TECHNIQUE: The patient was scanned on a [employer]. Axial non-contrast 3 mm slices were carried out through the heart. The data set was analyzed on a dedicated work station and scored using the Agatson method.   FINDINGS: Non-cardiac: See separate report from [facility].   Ascending Aorta: Normal caliber.  Small aortic calcification   Pericardium: Normal.   Coronary arteries: Normal origins.   Coronary Calcium Score:   Left main: 44   Left anterior descending artery: 193   Left circumflex artery: 0   Right coronary artery: 0   Total: 237   Percentile: 99th for age, sex, and race matched control.   IMPRESSION: 1. Coronary calcium score of 237. This was 99th percentile for age, gender, and race matched controls.   RECOMMENDATIONS:   Coronary artery calcium (CAC) score is a strong predictor of incident coronary heart disease (CHD) and provides predictive information beyond traditional risk factors. CAC scoring is reasonable to use in the decision to withhold, postpone, or initiate statin therapy in intermediate-risk or selected borderline-risk asymptomatic  adults (age 55-75 years and LDL-C >=70 to <190 mg/dL) who do not have diabetes or established atherosclerotic cardiovascular disease (ASCVD).* In intermediate-risk (10-year ASCVD risk >=7.5% to <20%) adults or selected borderline-risk (10-year ASCVD risk >=5% to <7.5%) adults in whom a CAC score is measured for the purpose of making  a treatment decision the following recommendations have been made:   If CAC = 0, it is reasonable to withhold statin therapy and reassess in 5 to 10 years, as long as higher risk conditions are absent (diabetes mellitus, family history of premature CHD in first degree relatives (males <55 years; females <65 years), cigarette smoking, LDL >=190 mg/dL or other independent risk factors).   If CAC is 1 to 99, it is reasonable to initiate statin therapy for patients >=58 years of age.   If CAC is >=100 or >=75th percentile, it is reasonable to initiate statin therapy at any age.   Cardiology referral should be considered for patients with CAC scores =400 or >=75th percentile.   *2018 AHA/ACC/AACVPR/AAPA/ABC/ACPM/ADA/AGS/APhA/ASPC/NLA/PCNA Guideline on the Management of Blood Cholesterol: A Report of the American College of Cardiology/American Heart Association Task Force on Clinical Practice Guidelines. J Am Coll Cardiol. 2019;73(24):3168-3209.   TTE OSH 05/2019: Summary    1. Technically difficult study due to body habitus.    2. The left ventricle is normal in size with normal wall thickness.    3. The left ventricular systolic function is normal, LVEF is visually  estimated at 55-60%.    4. There is grade I diastolic dysfunction (impaired relaxation).    5. The left atrium is mildly dilated in size.    6. The right ventricle is normal in size, with normal systolic function.    Left Ventricle    The left ventricle is normal in size with normal wall thickness.    The left ventricular systolic function is normal, LVEF is visually estimated  at  55-60%.    There is grade I diastolic dysfunction (impaired relaxation).   Right Ventricle    The right ventricle is normal in size, with normal systolic function.    Left Atrium    The left atrium is mildly dilated in size.   Right Atrium    The right atrium is normal  in size.    Aortic Valve    The aortic valve is trileaflet with normal appearing leaflets with normal  excursion.    There is no significant aortic regurgitation.    There is no evidence of a significant transvalvular gradient.   Pulmonic Valve    The pulmonic valve is normal.    There is no significant pulmonic regurgitation.    There is no evidence of a significant transvalvular gradient.   Mitral Valve    The mitral valve leaflets are normal with normal leaflet mobility.    There is no significant mitral valve regurgitation.   Tricuspid Valve    The tricuspid valve leaflets are normal, with normal leaflet mobility.    There is no significant tricuspid regurgitation.    Pulmonary systolic pressure cannot be estimated due to insufficient TR jet.     Recent Labs: No results found for requested labs within last 365 days.  Recent Lipid Panel    Component Value Date/Time   CHOL 115 01/07/2022 0908   TRIG 125 01/07/2022 0908   HDL 47 01/07/2022 0908   CHOLHDL 2.4 01/07/2022 0908   CHOLHDL 4 09/26/2011 0927   VLDL 42.2 (H) 09/26/2011 0927   LDLCALC 46 01/07/2022 0908   LDLDIRECT 146.4 09/26/2011 0927        Physical Exam:    VS:  BP (!) 140/82 (BP Location: Left Arm, Patient Position: Sitting, Cuff Size: Normal)   Pulse 88   Resp 16   Ht 5\' 3"  (1.6 m)   Wt  174 lb (78.9 kg)   SpO2 96%   BMI 30.82 kg/m     Wt Readings from Last 3 Encounters:  07/07/23 174 lb (78.9 kg)  07/05/22 171 lb (77.6 kg)  01/07/22 169 lb 9.6 oz (76.9 kg)    GEN: Well nourished, well developed in no acute distress HEENT: Normal NECK: No JVD; No carotid bruits LYMPHATICS: No lymphadenopathy CARDIAC:RRR, no murmurs,  rubs, gallops RESPIRATORY:  Clear to auscultation without rales, wheezing or rhonchi  ABDOMEN: Soft, non-tender, non-distended MUSCULOSKELETAL:  No edema; No deformity  SKIN: Warm and dry NEUROLOGIC:  Alert and oriented x 3 PSYCHIATRIC:  Normal affect  ASSESSMENT:    1. Coronary artery calcification   2. Hyperlipidemia, unspecified hyperlipidemia type   3. Palpitations   4. Primary hypertension     PLAN:    In order of problems listed above:   #CAD: -CTA with moderate mid-LAD disease with negative FFR.  -recently has been having episodes of chest tightness more with emotional stress and not with walking -Continue secondary prevention with statin therapy -Continue prescription drug management with aspirin 81 mg daily, Zetia 10 mg daily and atorvastatin 40 mg daily with as needed refills -I have recommended a Stress PET CT test to rule out ischemia due to new CP in setting of 50-69% mid LAD 2 years ago by CTA -Informed Consent   Shared Decision Making/Informed Consent The risks [chest pain, shortness of breath, cardiac arrhythmias, dizziness, blood pressure fluctuations, myocardial infarction, stroke/transient ischemic attack, nausea, vomiting, allergic reaction, radiation exposure, metallic taste sensation and life-threatening complications (estimated to be 1 in 10,000)], benefits (risk stratification, diagnosing coronary artery disease, treatment guidance) and alternatives of a cardiac PET stress test were discussed in detail with Priscilla Harris and she agrees to proceed.  #Hyperlipidemia -LDL goal less than 70 -Continue prescription drug management with atorvastatin 40 mg daily and Zetia 10 mg daily -I will get a copy of last FLP  #Palpitations: -Cardiac monitor 06/2021 overall normal with rare SVE but no arrhythmias or paues.  -TTE 05/2019 at OSH with EF 55-60%, G1DD, mild LAE, normal RV, no significant valve disease.  -Palpitations are well-controlled on CCB -Continue Cardizem  CD 300 mg daily with as needed refills  #HTN: -BP is controlled on exam today -Continue prescription drug management with Matzim LA 300 mg daily and benazepril HCT 20-12.5 mg daily with as needed refills -I have personally reviewed and interpreted outside labs performed by patient's PCP which showed    Follow up: 1 year    Medication Adjustments/Labs and Tests Ordered: Current medicines are reviewed at length with the patient today.  Concerns regarding medicines are outlined above.  Orders Placed This Encounter  Procedures   EKG 12-Lead   No orders of the defined types were placed in this encounter.  There are no Patient Instructions on file for this visit.    Signed, Armanda Magic, MD  07/07/2023 3:28 PM     [facility]

## 2023-08-03 ENCOUNTER — Encounter: Payer: Self-pay | Admitting: Cardiology

## 2023-08-04 ENCOUNTER — Encounter (HOSPITAL_COMMUNITY): Payer: Self-pay

## 2023-08-04 ENCOUNTER — Telehealth (HOSPITAL_COMMUNITY): Payer: Self-pay | Admitting: *Deleted

## 2023-08-04 MED ORDER — SEMAGLUTIDE(0.25 OR 0.5MG/DOS) 2 MG/3ML ~~LOC~~ SOPN: 0.5000 mg | PEN_INJECTOR | SUBCUTANEOUS | 11 refills | Status: DC

## 2023-08-04 NOTE — Telephone Encounter (Signed)
 Attempted to call patient regarding upcoming cardiac PET appointment. Left message on voicemail with name and callback number Johney Frame RN Navigator Cardiac Imaging [facility] [phone] Office  Advised to avoid caffeine for 12 hours prior to test.

## 2023-08-05 ENCOUNTER — Encounter (HOSPITAL_COMMUNITY)
Admission: RE | Admit: 2023-08-05 | Discharge: 2023-08-05 | Disposition: A | Discharge status: Home / Self Care | Source: Ambulatory Visit | Attending: Cardiology | Admitting: Cardiology

## 2023-08-05 DIAGNOSIS — R079 Chest pain, unspecified: Secondary | ICD-10-CM | POA: Diagnosis present

## 2023-08-05 LAB — NM PET CT CARDIAC PERFUSION MULTI W/ABSOLUTE BLOODFLOW
MBFR: 2.38
Nuc Rest EF: 61 %
Nuc Stress EF: 63 %
Rest MBF: 0.96 ml/g/min
Rest Nuclear Isotope Dose: 20.5 mCi
ST Depression (mm): 0 mm
Stress MBF: 2.28 ml/g/min
Stress Nuclear Isotope Dose: 20.5 mCi
TID: 0.9

## 2023-08-05 MED ORDER — REGADENOSON 0.4 MG/5ML IV SOLN
INTRAVENOUS | Status: AC
  Filled 2023-08-05: qty 5

## 2023-08-05 MED ORDER — RUBIDIUM RB82 GENERATOR (RUBYFILL)
20.5200 | PACK | Freq: Once | INTRAVENOUS | Status: AC
  Administered 2023-08-05: 20.52 via INTRAVENOUS

## 2023-08-05 MED ORDER — RUBIDIUM RB82 GENERATOR (RUBYFILL)
20.5400 | PACK | Freq: Once | INTRAVENOUS | Status: AC
  Administered 2023-08-05: 20.54 via INTRAVENOUS

## 2023-08-05 MED ORDER — REGADENOSON 0.4 MG/5ML IV SOLN
0.4000 mg | Freq: Once | INTRAVENOUS | Status: AC
  Administered 2023-08-05: 0.4 mg via INTRAVENOUS

## 2023-08-05 NOTE — Progress Notes (Signed)
 Pt. Tolerated lexi scan well despite bp dropping to 80's/40's

## 2023-10-23 ENCOUNTER — Encounter: Payer: Self-pay | Admitting: Cardiology

## 2023-10-24 NOTE — Telephone Encounter (Signed)
 Recommend seeing her PCP  Traci turner,MD7/17/25  8:44 PM

## 2023-12-24 ENCOUNTER — Encounter: Payer: Self-pay | Admitting: Cardiology

## 2023-12-25 ENCOUNTER — Telehealth: Payer: Self-pay | Admitting: Pharmacy Technician

## 2023-12-25 ENCOUNTER — Other Ambulatory Visit (HOSPITAL_COMMUNITY): Payer: Self-pay

## 2023-12-25 NOTE — Telephone Encounter (Signed)
 Priscilla Harris

## 2023-12-30 ENCOUNTER — Other Ambulatory Visit (HOSPITAL_COMMUNITY): Payer: Self-pay

## 2023-12-30 NOTE — Telephone Encounter (Signed)
 I had to call and start prior authorization over the phone. They said they did not receive prior auth initial req  Ins ph [phone] [facility]  They said the pharmacy needs to override in their system that this does not need a prior authorization. They said the dispensing pharmacy can call [phone]. They could not provide me with what the copay would be once this goes through. They kept saying it would be the pharmacy directly that would provide that.  I tried to call [phone] but they wouldn't talk to me since I am not a pharmacy

## 2024-01-01 MED ORDER — MOUNJARO 2.5 MG/0.5ML ~~LOC~~ SOAJ: 2.5000 mg | SUBCUTANEOUS | 0 refills | Status: DC

## 2024-01-01 NOTE — Addendum Note (Signed)
 Addended by: Eyonna Sandstrom D on: 01/01/2024 09:33 AM   Modules accepted: Orders

## 2024-02-11 ENCOUNTER — Other Ambulatory Visit: Payer: Self-pay | Admitting: Cardiology

## 2024-02-12 ENCOUNTER — Telehealth: Payer: Self-pay | Admitting: Cardiology

## 2024-02-12 NOTE — Telephone Encounter (Signed)
*  STAT* If patient is at the pharmacy, call can be transferred to refill team.   1. Which medications need to be refilled? (please list name of each medication and dose if known) tirzepatide  (MOUNJARO ) 2.5 MG/0.5ML Pen    2. Which pharmacy/location (including street and city if local pharmacy) is medication to be sent to? [facility] - [place], [place] - [address]    3. Do they need a 30 day or 90 day supply?  90 day supply

## 2024-02-13 NOTE — Telephone Encounter (Signed)
Sent in through another encounter.

## 2024-02-16 ENCOUNTER — Other Ambulatory Visit (HOSPITAL_COMMUNITY): Payer: Self-pay

## 2024-02-16 MED ORDER — MOUNJARO 5 MG/0.5ML ~~LOC~~ SOAJ: 5.0000 mg | SUBCUTANEOUS | 0 refills | Status: DC

## 2024-02-16 MED ORDER — MOUNJARO 2.5 MG/0.5ML ~~LOC~~ SOAJ
2.5000 mg | SUBCUTANEOUS | 0 refills | Status: DC
  Filled 2024-02-16: qty 2, 28d supply, fill #0

## 2024-02-16 NOTE — Addendum Note (Signed)
 Addended by: Tensley Wery D on: 02/16/2024 08:00 AM   Modules accepted: Orders

## 2024-02-16 NOTE — Addendum Note (Signed)
 Addended by: Hasan Douse D on: 02/16/2024 12:15 PM   Modules accepted: Orders

## 2024-03-08 ENCOUNTER — Other Ambulatory Visit: Payer: Self-pay | Admitting: Cardiology

## 2024-03-10 NOTE — Telephone Encounter (Signed)
 Spoke with patient. Will send a refill for Mounjaro  2.5 mg to her pharmacy and keep the previously sent prescription for 5 mg on file for use in 4 weeks.

## 2024-04-05 ENCOUNTER — Other Ambulatory Visit: Payer: Self-pay | Admitting: Cardiology

## 2024-04-07 MED ORDER — MOUNJARO 5 MG/0.5ML ~~LOC~~ SOAJ: 5.0000 mg | SUBCUTANEOUS | 0 refills | Status: AC

## 2024-04-07 NOTE — Addendum Note (Signed)
 Addended by: Dawsyn Zurn L on: 04/07/2024 07:45 AM   Modules accepted: Orders
